# Patient Record
Sex: Female | Born: 1985 | Race: White | Hispanic: Yes | Marital: Single | Smoking: Never smoker
Health system: Southern US, Community
[De-identification: ages and names within clinical notes are randomized; demographics above are authoritative.]

## PROBLEM LIST (undated history)

## (undated) ENCOUNTER — Emergency Department (HOSPITAL_COMMUNITY): Admission: EM | Payer: Self-pay

## (undated) DIAGNOSIS — K219 Gastro-esophageal reflux disease without esophagitis: Secondary | ICD-10-CM

## (undated) DIAGNOSIS — J189 Pneumonia, unspecified organism: Secondary | ICD-10-CM

## (undated) DIAGNOSIS — D649 Anemia, unspecified: Secondary | ICD-10-CM

## (undated) DIAGNOSIS — O1490 Unspecified pre-eclampsia, unspecified trimester: Secondary | ICD-10-CM

## (undated) DIAGNOSIS — D219 Benign neoplasm of connective and other soft tissue, unspecified: Secondary | ICD-10-CM

## (undated) DIAGNOSIS — O24419 Gestational diabetes mellitus in pregnancy, unspecified control: Secondary | ICD-10-CM

## (undated) DIAGNOSIS — F419 Anxiety disorder, unspecified: Secondary | ICD-10-CM

## (undated) HISTORY — PX: WISDOM TOOTH EXTRACTION: SHX21

## (undated) HISTORY — DX: Benign neoplasm of connective and other soft tissue, unspecified: D21.9

## (undated) HISTORY — DX: Gestational diabetes mellitus in pregnancy, unspecified control: O24.419

---

## 2014-08-12 ENCOUNTER — Ambulatory Visit: Payer: Self-pay | Admitting: Surgery

## 2014-08-12 NOTE — H&P (Signed)
Dana Smith 08/12/2014 10:56 AM Location: [facility] Patient #: [id number] DOB: 1985/05/25 Single / Language: Dana Smith / Race: White Female History of Present Illness Dana Moores A. Breeanne Oblinger MD; 08/12/2014 11:19 AM) Patient words: mass on upper back    Pt presents with the complaint of mass upper right back for 1 year. It mis located over right upper back and is causing mild to moderate discomfort and increasing in size. It causes a dull aching pain over right shoulder. No drainage.  The patient is a 29 year old female   Other Problems Dana Smith, CMA; 08/12/2014 10:56 AM) Back Pain  Past Surgical History Dana Smith, CMA; 08/12/2014 10:56 AM) No pertinent past surgical history  Diagnostic Studies History Dana Smith, CMA; 08/12/2014 10:56 AM) Colonoscopy never Mammogram never Pap Smear 1-5 years ago  Allergies Dana Smith, CMA; 08/12/2014 10:56 AM) No Known Drug Allergies 08/12/2014  Medication History Dana Smith, CMA; 08/12/2014 10:56 AM) No Current Medications Medications Reconciled  Social History Dana Smith, CMA; 08/12/2014 10:56 AM) Alcohol use Moderate alcohol use. Caffeine use Coffee. No drug use Tobacco use Never smoker.  Family History Dana Smith, [place]; 08/12/2014 10:56 AM) First Degree Relatives No pertinent family history  Pregnancy / Birth History Dana Smith, [place]; 08/12/2014 10:56 AM) Age at menarche 60 years. Gravida 0 Para 0 Regular periods     Review of Systems Dana Smith CMA; 08/12/2014 10:56 AM) General Not Present- Appetite Loss, Chills, Fatigue, Fever, Night Sweats, Weight Gain and Weight Loss. Skin Not Present- Change in Wart/Mole, Dryness, Hives, Jaundice, New Lesions, Non-Healing Wounds, Rash and Ulcer. HEENT Present- Wears glasses/contact lenses. Not Present- Earache, Hearing Loss, Hoarseness, Nose Bleed, Oral Ulcers, Ringing in the Ears, Seasonal Allergies, Sinus Pain, Sore Throat, Visual Disturbances and  Yellow Eyes. Respiratory Not Present- Bloody sputum, Chronic Cough, Difficulty Breathing, Snoring and Wheezing. Breast Not Present- Breast Mass, Breast Pain, Nipple Discharge and Skin Changes. Cardiovascular Not Present- Chest Pain, Difficulty Breathing Lying Down, Leg Cramps, Palpitations, Rapid Heart Rate, Shortness of Breath and Swelling of Extremities. Gastrointestinal Not Present- Abdominal Pain, Bloating, Bloody Stool, Change in Bowel Habits, Chronic diarrhea, Constipation, Difficulty Swallowing, Excessive gas, Gets full quickly at meals, Hemorrhoids, Indigestion, Nausea, Rectal Pain and Vomiting. Female Genitourinary Not Present- Frequency, Nocturia, Painful Urination, Pelvic Pain and Urgency. Musculoskeletal Present- Back Pain, Joint Stiffness and Muscle Pain. Not Present- Joint Pain, Muscle Weakness and Swelling of Extremities. Neurological Not Present- Decreased Memory, Fainting, Headaches, Numbness, Seizures, Tingling, Tremor, Trouble walking and Weakness. Psychiatric Not Present- Anxiety, Bipolar, Change in Sleep Pattern, Depression, Fearful and Frequent crying. Endocrine Not Present- Cold Intolerance, Excessive Hunger, Hair Changes, Heat Intolerance, Hot flashes and New Diabetes. Hematology Not Present- Easy Bruising, Excessive bleeding, Gland problems, HIV and Persistent Infections.  Vitals Dana Smith CMA; 08/12/2014 10:57 AM) 08/12/2014 10:56 AM Weight: 238 lb Height: 66in Body Surface Area: 2.24 m Body Mass Index: 38.41 kg/m Temp.: 98.108F(Oral)  Pulse: 89 (Regular)  Resp.: 18 (Unlabored)  BP: 122/78 (Sitting, Left Arm, Standard)     Physical Exam (Carmin Alvidrez A. Eleonore Shippee MD; 08/12/2014 11:24 AM)  General Mental Status-Alert. General Appearance-Consistent with stated age. Hydration-Well hydrated. Voice-Normal.  Integumentary Note: 6 cm x 6 cm mobilemas over rright scapula    Head and Neck Head-normocephalic, atraumatic with no lesions or  palpable masses. Trachea-midline. Thyroid Gland Characteristics - normal size and consistency.  Eye Eyeball - Bilateral-Extraocular movements intact. Sclera/Conjunctiva - Bilateral-No scleral icterus.  Chest and Lung Exam Note: lungs clear   Neurologic Neurologic evaluation reveals -alert  and oriented x 3 with no impairment of recent or remote memory. Mental Status-Normal.  Musculoskeletal Normal Exam - Left-Upper Extremity Strength Normal and Lower Extremity Strength Normal. Normal Exam - Right-Upper Extremity Strength Normal and Lower Extremity Strength Normal.    Assessment & Plan (Shalimar Mcclain A. 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 Risk include bleeding infection nerve injury recurrence seroma. Non surgical options discussed. She would like to proceed.  Current Plans Pt Education - Benign Tumors: discussed with patient and provided information. Pt Education - CCS Free Text Education/Instructions: discussed with patient and provided information.

## 2018-08-11 ENCOUNTER — Ambulatory Visit (HOSPITAL_COMMUNITY)
Admission: AD | Admit: 2018-08-11 | Discharge: 2018-08-12 | Disposition: A | Discharge status: Home / Self Care | Payer: 59 | Attending: Obstetrics and Gynecology | Admitting: Obstetrics and Gynecology

## 2018-08-11 ENCOUNTER — Encounter (HOSPITAL_COMMUNITY): Payer: Self-pay | Admitting: *Deleted

## 2018-08-11 ENCOUNTER — Encounter (HOSPITAL_COMMUNITY)
Admission: AD | Disposition: A | Discharge status: Home / Self Care | Payer: Self-pay | Source: Home / Self Care | Attending: Obstetrics and Gynecology

## 2018-08-11 ENCOUNTER — Inpatient Hospital Stay (HOSPITAL_COMMUNITY): Payer: 59

## 2018-08-11 ENCOUNTER — Other Ambulatory Visit: Payer: Self-pay

## 2018-08-11 DIAGNOSIS — Z1159 Encounter for screening for other viral diseases: Secondary | ICD-10-CM | POA: Diagnosis not present

## 2018-08-11 DIAGNOSIS — R102 Pelvic and perineal pain unspecified side: Secondary | ICD-10-CM

## 2018-08-11 DIAGNOSIS — Z79899 Other long term (current) drug therapy: Secondary | ICD-10-CM | POA: Insufficient documentation

## 2018-08-11 DIAGNOSIS — O009 Unspecified ectopic pregnancy without intrauterine pregnancy: Secondary | ICD-10-CM | POA: Diagnosis present

## 2018-08-11 DIAGNOSIS — R100 Acute abdomen: Secondary | ICD-10-CM | POA: Diagnosis not present

## 2018-08-11 DIAGNOSIS — O00102 Left tubal pregnancy without intrauterine pregnancy: Secondary | ICD-10-CM | POA: Diagnosis not present

## 2018-08-11 DIAGNOSIS — K429 Umbilical hernia without obstruction or gangrene: Secondary | ICD-10-CM | POA: Diagnosis not present

## 2018-08-11 DIAGNOSIS — O039 Complete or unspecified spontaneous abortion without complication: Secondary | ICD-10-CM

## 2018-08-11 HISTORY — PX: LAPAROSCOPY: SHX197

## 2018-08-11 LAB — URINALYSIS, ROUTINE W REFLEX MICROSCOPIC
Bilirubin Urine: NEGATIVE
Glucose, UA: NEGATIVE mg/dL
Hgb urine dipstick: NEGATIVE
Ketones, ur: 80 mg/dL — AB
Nitrite: NEGATIVE
Protein, ur: 30 mg/dL — AB
Specific Gravity, Urine: 1.032 — ABNORMAL HIGH (ref 1.005–1.030)
pH: 5 (ref 5.0–8.0)

## 2018-08-11 LAB — COMPREHENSIVE METABOLIC PANEL
ALT: 15 U/L (ref 0–44)
AST: 20 U/L (ref 15–41)
Albumin: 3.5 g/dL (ref 3.5–5.0)
Alkaline Phosphatase: 90 U/L (ref 38–126)
Anion gap: 10 (ref 5–15)
BUN: 12 mg/dL (ref 6–20)
CO2: 23 mmol/L (ref 22–32)
Calcium: 9.1 mg/dL (ref 8.9–10.3)
Chloride: 103 mmol/L (ref 98–111)
Creatinine, Ser: 0.8 mg/dL (ref 0.44–1.00)
GFR calc Af Amer: >60 mL/min (ref >60)
GFR calc non Af Amer: >60 mL/min (ref >60)
Glucose, Bld: 183 mg/dL — ABNORMAL HIGH (ref 70–99)
Potassium: 4.6 mmol/L (ref 3.5–5.1)
Sodium: 136 mmol/L (ref 135–145)
Total Bilirubin: 0.8 mg/dL (ref 0.3–1.2)
Total Protein: 6.6 g/dL (ref 6.5–8.1)

## 2018-08-11 LAB — CBC WITH DIFFERENTIAL/PLATELET
Abs Immature Granulocytes: 0.07 10*3/uL (ref 0.00–0.07)
Basophils Absolute: 0 10*3/uL (ref 0.0–0.1)
Basophils Relative: 0 %
Eosinophils Absolute: 0 10*3/uL (ref 0.0–0.5)
Eosinophils Relative: 0 %
HCT: 33.8 % — ABNORMAL LOW (ref 36.0–46.0)
Hemoglobin: 10.5 g/dL — ABNORMAL LOW (ref 12.0–15.0)
Immature Granulocytes: 0 %
Lymphocytes Relative: 6 %
Lymphs Abs: 1 10*3/uL (ref 0.7–4.0)
MCH: 26.9 pg (ref 26.0–34.0)
MCHC: 31.1 g/dL (ref 30.0–36.0)
MCV: 86.7 fL (ref 80.0–100.0)
Monocytes Absolute: 0.5 10*3/uL (ref 0.1–1.0)
Monocytes Relative: 3 %
Neutro Abs: 14.1 10*3/uL — ABNORMAL HIGH (ref 1.7–7.7)
Neutrophils Relative %: 91 %
Platelets: 308 10*3/uL (ref 150–400)
RBC: 3.9 MIL/uL (ref 3.87–5.11)
RDW: 14.5 % (ref 11.5–15.5)
WBC: 15.6 10*3/uL — ABNORMAL HIGH (ref 4.0–10.5)
nRBC: 0 % (ref 0.0–0.2)

## 2018-08-11 LAB — WET PREP, GENITAL
Clue Cells Wet Prep HPF POC: NONE SEEN
Sperm: NONE SEEN
Trich, Wet Prep: NONE SEEN

## 2018-08-11 LAB — HCG, QUANTITATIVE, PREGNANCY: hCG, Beta Chain, Quant, S: 136 m[IU]/mL — ABNORMAL HIGH (ref <5)

## 2018-08-11 SURGERY — LAPAROSCOPY, DIAGNOSTIC
Anesthesia: General | Laterality: Left

## 2018-08-11 MED ORDER — IOHEXOL 300 MG/ML  SOLN
100.0000 mL | Freq: Once | INTRAMUSCULAR | Status: AC | PRN
  Administered 2018-08-11: 100 mL via INTRAVENOUS

## 2018-08-11 MED ORDER — BARIUM SULFATE 2.1 % PO SUSP: 450.0000 mL | Freq: Once | ORAL | Status: DC

## 2018-08-11 MED ORDER — HYDROMORPHONE HCL 2 MG PO TABS
1.0000 mg | ORAL_TABLET | ORAL | Status: DC | PRN
  Filled 2018-08-11: qty 1

## 2018-08-11 MED ORDER — SUFENTANIL CITRATE 50 MCG/ML IV SOLN
INTRAVENOUS | Status: AC
  Filled 2018-08-11: qty 1

## 2018-08-11 MED ORDER — SODIUM CHLORIDE 0.9 % IV SOLN
8.0000 mg | Freq: Once | INTRAVENOUS | Status: AC
  Administered 2018-08-11: 21:00:00 8 mg via INTRAVENOUS
  Filled 2018-08-11: qty 4

## 2018-08-11 MED ORDER — MIDAZOLAM HCL 2 MG/2ML IJ SOLN
INTRAMUSCULAR | Status: AC
  Filled 2018-08-11: qty 2

## 2018-08-11 MED ORDER — HYDROMORPHONE HCL 1 MG/ML IJ SOLN
1.0000 mg | INTRAMUSCULAR | Status: DC | PRN
  Administered 2018-08-11 (×2): 1 mg via INTRAVENOUS
  Filled 2018-08-11 (×2): qty 1

## 2018-08-11 MED ORDER — LACTATED RINGERS IV SOLN
INTRAVENOUS | Status: DC
  Administered 2018-08-11 – 2018-08-12 (×2): via INTRAVENOUS

## 2018-08-11 MED ORDER — LACTATED RINGERS IV BOLUS
1000.0000 mL | Freq: Once | INTRAVENOUS | Status: AC
  Administered 2018-08-11: 1000 mL via INTRAVENOUS

## 2018-08-11 MED ORDER — PROPOFOL 10 MG/ML IV BOLUS
INTRAVENOUS | Status: AC
  Filled 2018-08-11: qty 20

## 2018-08-11 MED ORDER — PROMETHAZINE HCL 25 MG/ML IJ SOLN
12.5000 mg | Freq: Once | INTRAMUSCULAR | Status: AC
  Administered 2018-08-11: 12.5 mg via INTRAVENOUS
  Filled 2018-08-11: qty 1

## 2018-08-11 SURGICAL SUPPLY — 37 items
CABLE HIGH FREQUENCY MONO STRZ (ELECTRODE) IMPLANT
CATH ROBINSON RED A/P 16FR (CATHETERS) IMPLANT
COVER WAND RF STERILE (DRAPES) ×3 IMPLANT
DECANTER SPIKE VIAL GLASS SM (MISCELLANEOUS) ×3 IMPLANT
DERMABOND ADVANCED (GAUZE/BANDAGES/DRESSINGS) ×2
DERMABOND ADVANCED .7 DNX12 (GAUZE/BANDAGES/DRESSINGS) ×1 IMPLANT
DRSG OPSITE POSTOP 3X4 (GAUZE/BANDAGES/DRESSINGS) ×3 IMPLANT
DURAPREP 26ML APPLICATOR (WOUND CARE) ×3 IMPLANT
FILTER SMOKE EVAC LAPAROSHD (FILTER) ×3 IMPLANT
GLOVE BIO SURGEON STRL SZ 6.5 (GLOVE) ×2 IMPLANT
GLOVE BIO SURGEONS STRL SZ 6.5 (GLOVE) ×1
GLOVE BIOGEL PI IND STRL 7.0 (GLOVE) ×1 IMPLANT
GLOVE BIOGEL PI INDICATOR 7.0 (GLOVE) ×2
GOWN STRL REUS W/ TWL LRG LVL3 (GOWN DISPOSABLE) ×2 IMPLANT
GOWN STRL REUS W/TWL LRG LVL3 (GOWN DISPOSABLE) ×4
HIBICLENS CHG 4% 4OZ BTL (MISCELLANEOUS) ×3 IMPLANT
KIT TURNOVER KIT B (KITS) ×3 IMPLANT
MANIPULATOR UTERINE 7CM CLEARV (MISCELLANEOUS) IMPLANT
NS IRRIG 1000ML POUR BTL (IV SOLUTION) ×3 IMPLANT
PACK LAPAROSCOPY BASIN (CUSTOM PROCEDURE TRAY) ×3 IMPLANT
PACK TRENDGUARD 450 HYBRID PRO (MISCELLANEOUS) ×1 IMPLANT
POUCH SPECIMEN RETRIEVAL 10MM (ENDOMECHANICALS) ×3 IMPLANT
PROTECTOR NERVE ULNAR (MISCELLANEOUS) ×6 IMPLANT
SET IRRIG TUBING LAPAROSCOPIC (IRRIGATION / IRRIGATOR) ×3 IMPLANT
SET TUBE SMOKE EVAC HIGH FLOW (TUBING) ×3 IMPLANT
SHEARS HARMONIC ACE PLUS 36CM (ENDOMECHANICALS) ×3 IMPLANT
SLEEVE ENDOPATH XCEL 5M (ENDOMECHANICALS) ×3 IMPLANT
SOLUTION ELECTROLUBE (MISCELLANEOUS) ×3 IMPLANT
SUT VIC AB 3-0 PS2 18 (SUTURE) ×2
SUT VIC AB 3-0 PS2 18XBRD (SUTURE) ×1 IMPLANT
SUT VICRYL 0 UR6 27IN ABS (SUTURE) ×3 IMPLANT
SYSTEM CARTER THOMASON II (TROCAR) ×3 IMPLANT
TOWEL GREEN STERILE FF (TOWEL DISPOSABLE) ×6 IMPLANT
TRENDGUARD 450 HYBRID PRO PACK (MISCELLANEOUS) ×3
TROCAR XCEL NON-BLD 11X100MML (ENDOMECHANICALS) ×3 IMPLANT
TROCAR XCEL NON-BLD 5MMX100MML (ENDOMECHANICALS) ×3 IMPLANT
WARMER LAPAROSCOPE (MISCELLANEOUS) ×3 IMPLANT

## 2018-08-11 NOTE — MAU Provider Note (Addendum)
History     CSN: [id number]  Arrival date and time: 08/11/18 1515   First Provider Initiated Contact with Patient 08/11/18 1622      Chief Complaint  Patient presents with  . Abdominal Pain   G1P0 s/p SAB 3 weeks ago presenting with LAP. Pain started last night. Describes as achy and constant, bilateral and central. Rates 9/10. Took Tylenol but didn't help. Had N/V last night and twice this am. No fevers but had chills. She is no longer bleeding, stopped 2 days ago. Denies urinary sx but is being treated for presumed UTI after video office visit today.    OB History    Gravida  1   Para      Term      Preterm      AB  1   Living  0     SAB  1   TAB      Ectopic      Multiple      Live Births  0           Past Medical History:  Diagnosis Date  . Medical history non-contributory     Past Surgical History:  Procedure Laterality Date  . WISDOM TOOTH EXTRACTION Bilateral     Family History  Problem Relation Age of Onset  . Healthy Mother   . Healthy Father     Social History   Tobacco Use  . Smoking status: Never Smoker  . Smokeless tobacco: Never Used  Substance Use Topics  . Alcohol use: Yes    Comment: Socially  . Drug use: Never    Allergies: No Known Allergies  Medications Prior to Admission  Medication Sig Dispense Refill Last Dose  . acetaminophen (TYLENOL) 500 MG tablet Take 1,000 mg by mouth every 6 (six) hours as needed.   08/11/2018 at 1200    Review of Systems  Constitutional: Positive for chills. Negative for fever.  Gastrointestinal: Positive for abdominal pain, nausea and vomiting. Negative for constipation and diarrhea.  Genitourinary: Negative for dysuria, hematuria, urgency, vaginal bleeding and vaginal discharge.   Physical Exam   Blood pressure (!) 104/57, pulse 75, temperature 97.9 F (36.6 C), temperature source Oral, resp. rate 16, last menstrual period 05/14/2018, SpO2 100 %.  Physical Exam  Constitutional:  She is oriented to person, place, and time. She appears well-developed and well-nourished. No distress.  HENT:  Head: Normocephalic and atraumatic.  Neck: Normal range of motion.  Cardiovascular: Normal rate.  Respiratory: Effort normal. No respiratory distress.  GI: Soft. She exhibits no distension and no mass. There is abdominal tenderness in the right lower quadrant, suprapubic area and left lower quadrant. There is guarding. There is no rebound.  Genitourinary:    Genitourinary Comments: External: no lesions or erythema Vagina: rugated, pink, moist, large amt adherent curdy white discharge Uterus: non enlarged, anteverted, ++ tender, + CMT Adnexae: no masses, ++tenderness left, ++ tenderness right Cervix closed, normal    Musculoskeletal: Normal range of motion.  Neurological: She is alert and oriented to person, place, and time.  Skin: Skin is warm and dry.  Psychiatric: She has a normal mood and affect.   Results for orders placed or performed during the hospital encounter of 08/11/18 (from the past 24 hour(s))  Wet prep, genital     Status: Abnormal   Collection Time: 08/11/18  4:40 PM   Specimen: Vaginal  Result Value Ref Range   Yeast Wet Prep HPF POC PRESENT (A) NONE SEEN  Trich, Wet Prep NONE SEEN NONE SEEN   Clue Cells Wet Prep HPF POC NONE SEEN NONE SEEN   WBC, Wet Prep HPF POC MANY (A) NONE SEEN   Sperm NONE SEEN   Comprehensive metabolic panel     Status: Abnormal   Collection Time: 08/11/18  4:49 PM  Result Value Ref Range   Sodium 136 135 - 145 mmol/L   Potassium 4.6 3.5 - 5.1 mmol/L   Chloride 103 98 - 111 mmol/L   CO2 23 22 - 32 mmol/L   Glucose, Bld 183 (H) 70 - 99 mg/dL   BUN 12 6 - 20 mg/dL   Creatinine, Ser 0.80 0.44 - 1.00 mg/dL   Calcium 9.1 8.9 - 10.3 mg/dL   Total Protein 6.6 6.5 - 8.1 g/dL   Albumin 3.5 3.5 - 5.0 g/dL   AST 20 15 - 41 U/L   ALT 15 0 - 44 U/L   Alkaline Phosphatase 90 38 - 126 U/L   Total Bilirubin 0.8 0.3 - 1.2 mg/dL   GFR  calc non Af Amer >60 >60 mL/min   GFR calc Af Amer >60 >60 mL/min   Anion gap 10 5 - 15  CBC with Differential/Platelet     Status: Abnormal   Collection Time: 08/11/18  4:49 PM  Result Value Ref Range   WBC 15.6 (H) 4.0 - 10.5 K/uL   RBC 3.90 3.87 - 5.11 MIL/uL   Hemoglobin 10.5 (L) 12.0 - 15.0 g/dL   HCT 33.8 (L) 36.0 - 46.0 %   MCV 86.7 80.0 - 100.0 fL   MCH 26.9 26.0 - 34.0 pg   MCHC 31.1 30.0 - 36.0 g/dL   RDW 14.5 11.5 - 15.5 %   Platelets 308 150 - 400 K/uL   nRBC 0.0 0.0 - 0.2 %   Neutrophils Relative % 91 %   Neutro Abs 14.1 (H) 1.7 - 7.7 K/uL   Lymphocytes Relative 6 %   Lymphs Abs 1.0 0.7 - 4.0 K/uL   Monocytes Relative 3 %   Monocytes Absolute 0.5 0.1 - 1.0 K/uL   Eosinophils Relative 0 %   Eosinophils Absolute 0.0 0.0 - 0.5 K/uL   Basophils Relative 0 %   Basophils Absolute 0.0 0.0 - 0.1 K/uL   Immature Granulocytes 0 %   Abs Immature Granulocytes 0.07 0.00 - 0.07 K/uL  Urinalysis, Routine w reflex microscopic     Status: Abnormal   Collection Time: 08/11/18  6:28 PM  Result Value Ref Range   Color, Urine AMBER (A) YELLOW   APPearance CLEAR CLEAR   Specific Gravity, Urine 1.032 (H) 1.005 - 1.030   pH 5.0 5.0 - 8.0   Glucose, UA NEGATIVE NEGATIVE mg/dL   Hgb urine dipstick NEGATIVE NEGATIVE   Bilirubin Urine NEGATIVE NEGATIVE   Ketones, ur 80 (A) NEGATIVE mg/dL   Protein, ur 30 (A) NEGATIVE mg/dL   Nitrite NEGATIVE NEGATIVE   Leukocytes,Ua MODERATE (A) NEGATIVE   RBC / HPF 0-5 0 - 5 RBC/hpf   WBC, UA 21-50 0 - 5 WBC/hpf   Bacteria, UA RARE (A) NONE SEEN   Squamous Epithelial / LPF 6-10 0 - 5   Mucus PRESENT    US Ob Transvaginal  Result Date: 08/11/2018 CLINICAL DATA:  History of spontaneous abortion.  Pelvic pain. EXAM: TRANSVAGINAL OB ULTRASOUND TECHNIQUE: Transvaginal ultrasound was performed for complete evaluation of the gestation as well as the maternal uterus, adnexal regions, and pelvic cul-de-sac. COMPARISON:  None. FINDINGS: Intrauterine  gestational sac:  None Yolk sac:  N/A Embryo:  N/A Cardiac Activity: N/A Heart Rate: N/A bpm Subchorionic hemorrhage:  N/A Maternal uterus/adnexae: No endometrial thickening or abnormal enhancement. No endometrial fluid collections. Both ovaries are normal. The right ovary measures 3.6 x 2.4 x 2.7 cm and the left ovary measures 2.7 x 1.9 x 1.6 cm. Small amount of free pelvic fluid with inhomogeneous complex fluid in the para necks or regions bilaterally. Possible ruptured cyst. IMPRESSION: 1. Normal sonographic appearance of the uterus. No findings suspicious for retained products. 2. Normal ovaries. 3. Small amount of fluid in the cul-de-sac and complex fluid in both periadnexal regions. Possible ruptured cyst. Electronically Signed   By: Marijo Sanes M.D.   On: 08/11/2018 18:08   Ct Abdomen Pelvis W Contrast  Result Date: 08/11/2018 CLINICAL DATA:  Acute abdominal pain. Recent miscarriage. EXAM: CT ABDOMEN AND PELVIS WITH CONTRAST TECHNIQUE: Multidetector CT imaging of the abdomen and pelvis was performed using the standard protocol following bolus administration of intravenous contrast. CONTRAST:  165mL OMNIPAQUE IOHEXOL 300 MG/ML  SOLN COMPARISON:  Pelvic ultrasound earlier this day. FINDINGS: Lower chest: Lung bases are clear. Hepatobiliary: No focal liver abnormality is seen. No gallstones, gallbladder wall thickening, or biliary dilatation. Pancreas: No ductal dilatation or inflammation. Spleen: Normal in size without focal abnormality. Adrenals/Urinary Tract: Normal adrenal glands. No hydronephrosis or perinephric edema. Homogeneous renal enhancement. Urinary bladder is decompressed and not well evaluated. Stomach/Bowel: High-density fluid insinuating between small bowel loops in the left abdomen. No associated small bowel inflammation or wall thickening. No bowel obstruction. Appendix is normal, for example image 67 series 3. Vascular/Lymphatic: Normal caliber abdominal aorta. No adenopathy.  Reproductive: Heterogeneous lesion in the left adnexa measures 4.5 x 5.1 cm. There is some irregular enhancement about the periphery, and difficult to confirm all of contrast is intravascular. Large amount of heterogeneous fluid in the pelvis tracks into the pericolic gutters, throughout the mesentery and into the upper abdomen. Other: Moderate to large volume complex free fluid appears centered in the pelvis, tracking into the pericolic gutters and bilateral upper quadrants. This is most prominent adjacent to the left ovary where there regular foci of enhancement, difficult to exclude active bleeding. No free intra-abdominal air. Tiny fat containing umbilical hernia. Musculoskeletal: There are no acute or suspicious osseous abnormalities. IMPRESSION: Moderate to large amount of hemorrhage in the abdomen and pelvis. Source appears to be the left adnexa where there is 5.1 x 4.5 cm heterogeneous ill-defined adnexal lesion with enhancement. Blood tracks into both pericolic gutters, small bowel mesentery and upper abdomen. Differential considerations include a ruptured hemorrhagic ovarian cyst with possible active bleeding, versus ruptured ectopic pregnancy. These results were called by telephone at the time of interpretation on 08/11/2018 at 10:56 pm to [facility] , who verbally acknowledged these results. Electronically Signed   By: Keith Rake M.D.   On: 08/11/2018 22:58    MAU Course  Procedures Orders Placed This Encounter  Procedures  . Wet prep, genital    Standing Status:   Standing    Number of Occurrences:   1    Order Specific Question:   Patient immune status    Answer:   Normal  . Culture, Urine    Standing Status:   Standing    Number of Occurrences:   1  . US OB Transvaginal    R/o retained POCs    Standing Status:   Standing    Number of Occurrences:   1    Order  Specific Question:   Symptom/Reason for Exam    Answer:   SAB (spontaneous abortion) [[id number]]    Order Specific  Question:   Symptom/Reason for Exam    Answer:   Pelvic pain [[id number]]  . CT ABDOMEN PELVIS W CONTRAST    Standing Status:   Standing    Number of Occurrences:   1    Order Specific Question:   Does the patient have a contrast media/X-ray dye allergy?    Answer:   No    Order Specific Question:   If indicated for the ordered procedure, I authorize the administration of contrast media per Radiology protocol    Answer:   Yes    Order Specific Question:   Is Oral Contrast requested for this exam?    Answer:   Yes, Per Radiology protocol    Order Specific Question:   Radiology Contrast Protocol - do NOT remove file path    Answer:   [email or web address]  . Comprehensive metabolic panel    Standing Status:   Standing    Number of Occurrences:   1  . CBC with Differential/Platelet    Standing Status:   Standing    Number of Occurrences:   1  . Urinalysis, Routine w reflex microscopic    Standing Status:   Standing    Number of Occurrences:   1   Meds ordered this encounter  Medications  . lactated ringers bolus 1,000 mL  . DISCONTD: HYDROmorphone (DILAUDID) tablet 1 mg  . HYDROmorphone (DILAUDID) injection 1 mg  . ondansetron (ZOFRAN) 8 mg in sodium chloride 0.9 % 50 mL IVPB  . lactated ringers infusion  . DISCONTD: Barium Sulfate 2.1 % SUSP 450 mL  . DISCONTD: Barium Sulfate 2.1 % SUSP 450 mL   MDM Labs and [place] ordered and reviewed. [place] normal. Pain modestly improved. CT ordered and pending. Transfer of care given to [place], [place]  08/11/2018 9:03 PM   Assessment and Plan  Follow Up (10:55 PM)  -Threasa Beards [place] calls from reading room to confirm patient with previous IUP as she feels findings may be c/w ruptured ectopic.  -However if known IUP then most likely ruptured hemorrhagic cyst from left ovary. -Informed that patient being followed by private practice and SAB occurred 3 weeks ago. -Further informed that [place] also reported possible  ruptured hemorrhagic cyst. -Threasa Beards expresses concern for amount of free fluid in abdomen.  Follow Up (11:17 PM)  -Official report received. -Impression citing possible active bleeding. -Dr. Eddie North consulted and informed of Radiologist findings; Advises  *Contact primary gyn. *Order quant hCG. -Dr. Terri Piedra consulted and updated on patient status. -In room to update patient; appears stable and in no apparent distress. -Patient informed of findings and need for surgical intervention. -Further informed that Dr. Terri Piedra en route and would discuss POC in further detail. -Co-Vid test and T&S ordered.  Maryann Conners MSN, CNM 08/11/2018 11:35 PM

## 2018-08-11 NOTE — MAU Note (Signed)
Having a lot of pelvic pain.  Started yesterday, vomiting off and on. "just bile cause there is nothing in her stomach".  Had a miscarriage, still going in for hormone checks, was 300 yesterday. Stopped bleeding 2 days ago.  Being followed at [facility]. Virtual visit today, being treated for a UTI.  Now having chills and dizziness, started in last 2-3 hrs. (pt clammy, lips very pale)

## 2018-08-11 NOTE — MAU Note (Signed)
[place] tech reports pt got sick when she brought her back

## 2018-08-12 ENCOUNTER — Inpatient Hospital Stay (HOSPITAL_COMMUNITY): Payer: 59 | Admitting: Anesthesiology

## 2018-08-12 ENCOUNTER — Encounter (HOSPITAL_COMMUNITY): Payer: Self-pay

## 2018-08-12 DIAGNOSIS — R100 Acute abdomen: Secondary | ICD-10-CM | POA: Diagnosis present

## 2018-08-12 LAB — SARS CORONAVIRUS 2: SARS Coronavirus 2: NOT DETECTED

## 2018-08-12 LAB — CBC
HCT: 33.7 % — ABNORMAL LOW (ref 36.0–46.0)
Hemoglobin: 11 g/dL — ABNORMAL LOW (ref 12.0–15.0)
MCH: 27.9 pg (ref 26.0–34.0)
MCHC: 32.6 g/dL (ref 30.0–36.0)
MCV: 85.5 fL (ref 80.0–100.0)
Platelets: 259 10*3/uL (ref 150–400)
RBC: 3.94 MIL/uL (ref 3.87–5.11)
RDW: 14 % (ref 11.5–15.5)
WBC: 12.9 10*3/uL — ABNORMAL HIGH (ref 4.0–10.5)
nRBC: 0 % (ref 0.0–0.2)

## 2018-08-12 LAB — ABO/RH: ABO/RH(D): O POS

## 2018-08-12 MED ORDER — IBUPROFEN 600 MG PO TABS: 600.0000 mg | ORAL_TABLET | Freq: Four times a day (QID) | ORAL | 1 refills | Status: DC | PRN

## 2018-08-12 MED ORDER — LACTATED RINGERS IV SOLN
INTRAVENOUS | Status: DC | PRN
  Administered 2018-08-12: 01:00:00 via INTRAVENOUS

## 2018-08-12 MED ORDER — PANTOPRAZOLE SODIUM 40 MG IV SOLR: 40.0000 mg | Freq: Every day | INTRAVENOUS | Status: DC

## 2018-08-12 MED ORDER — OXYCODONE-ACETAMINOPHEN 5-325 MG PO TABS
1.0000 | ORAL_TABLET | ORAL | Status: DC | PRN
  Administered 2018-08-12: 1 via ORAL
  Filled 2018-08-12: qty 1

## 2018-08-12 MED ORDER — SUCCINYLCHOLINE CHLORIDE 200 MG/10ML IV SOSY
PREFILLED_SYRINGE | INTRAVENOUS | Status: AC
  Filled 2018-08-12: qty 10

## 2018-08-12 MED ORDER — ONDANSETRON HCL 4 MG/2ML IJ SOLN
INTRAMUSCULAR | Status: DC | PRN
  Administered 2018-08-12: 4 mg via INTRAVENOUS

## 2018-08-12 MED ORDER — BUPIVACAINE HCL (PF) 0.25 % IJ SOLN
INTRAMUSCULAR | Status: DC | PRN
  Administered 2018-08-12: 14 mL

## 2018-08-12 MED ORDER — ONDANSETRON HCL 4 MG/2ML IJ SOLN
INTRAMUSCULAR | Status: AC
  Filled 2018-08-12: qty 2

## 2018-08-12 MED ORDER — LIDOCAINE 2% (20 MG/ML) 5 ML SYRINGE
INTRAMUSCULAR | Status: AC
  Filled 2018-08-12: qty 5

## 2018-08-12 MED ORDER — OXYCODONE-ACETAMINOPHEN 5-325 MG PO TABS: 1.0000 | ORAL_TABLET | ORAL | 0 refills | Status: AC | PRN

## 2018-08-12 MED ORDER — PROPOFOL 10 MG/ML IV BOLUS
INTRAVENOUS | Status: DC | PRN
  Administered 2018-08-12: 50 mg via INTRAVENOUS
  Administered 2018-08-12: 150 mg via INTRAVENOUS

## 2018-08-12 MED ORDER — SODIUM CHLORIDE 0.9 % IR SOLN
Status: DC | PRN
  Administered 2018-08-12: 3000 mL

## 2018-08-12 MED ORDER — SODIUM CHLORIDE 0.9 % IV SOLN
3.0000 g | INTRAVENOUS | Status: AC
  Administered 2018-08-12: 02:00:00 3 g via INTRAVENOUS
  Filled 2018-08-12: qty 3

## 2018-08-12 MED ORDER — MIDAZOLAM HCL 5 MG/5ML IJ SOLN
INTRAMUSCULAR | Status: DC | PRN
  Administered 2018-08-12: 2 mg via INTRAVENOUS

## 2018-08-12 MED ORDER — KETOROLAC TROMETHAMINE 30 MG/ML IJ SOLN
30.0000 mg | Freq: Once | INTRAMUSCULAR | Status: AC
  Administered 2018-08-12: 30 mg via INTRAVENOUS

## 2018-08-12 MED ORDER — ONDANSETRON HCL 4 MG/2ML IJ SOLN: 4.0000 mg | Freq: Four times a day (QID) | INTRAMUSCULAR | Status: DC | PRN

## 2018-08-12 MED ORDER — KETOROLAC TROMETHAMINE 30 MG/ML IJ SOLN
INTRAMUSCULAR | Status: AC
  Filled 2018-08-12: qty 1

## 2018-08-12 MED ORDER — SIMETHICONE 80 MG PO CHEW: 80.0000 mg | CHEWABLE_TABLET | Freq: Four times a day (QID) | ORAL | Status: DC | PRN

## 2018-08-12 MED ORDER — SUFENTANIL CITRATE 50 MCG/ML IV SOLN
INTRAVENOUS | Status: DC | PRN
  Administered 2018-08-12 (×5): 10 ug via INTRAVENOUS

## 2018-08-12 MED ORDER — BUPIVACAINE HCL (PF) 0.25 % IJ SOLN
INTRAMUSCULAR | Status: AC
  Filled 2018-08-12: qty 30

## 2018-08-12 MED ORDER — ALBUMIN HUMAN 5 % IV SOLN: 12.5000 g | Freq: Once | INTRAVENOUS | Status: AC

## 2018-08-12 MED ORDER — DEXAMETHASONE SODIUM PHOSPHATE 10 MG/ML IJ SOLN
INTRAMUSCULAR | Status: DC | PRN
  Administered 2018-08-12: 10 mg via INTRAVENOUS

## 2018-08-12 MED ORDER — PROPOFOL 10 MG/ML IV BOLUS
INTRAVENOUS | Status: AC
  Filled 2018-08-12: qty 20

## 2018-08-12 MED ORDER — LIDOCAINE HCL (CARDIAC) PF 100 MG/5ML IV SOSY
PREFILLED_SYRINGE | INTRAVENOUS | Status: DC | PRN
  Administered 2018-08-12: 100 mg via INTRATRACHEAL

## 2018-08-12 MED ORDER — DEXAMETHASONE SODIUM PHOSPHATE 10 MG/ML IJ SOLN
INTRAMUSCULAR | Status: AC
  Filled 2018-08-12: qty 1

## 2018-08-12 MED ORDER — ALBUMIN HUMAN 5 % IV SOLN
INTRAVENOUS | Status: AC
  Administered 2018-08-12: 12.5 g via INTRAVENOUS
  Filled 2018-08-12: qty 250

## 2018-08-12 MED ORDER — ONDANSETRON HCL 4 MG PO TABS: 4.0000 mg | ORAL_TABLET | Freq: Four times a day (QID) | ORAL | Status: DC | PRN

## 2018-08-12 MED ORDER — FENTANYL CITRATE (PF) 100 MCG/2ML IJ SOLN: 25.0000 ug | INTRAMUSCULAR | Status: DC | PRN

## 2018-08-12 MED ORDER — SUCCINYLCHOLINE CHLORIDE 20 MG/ML IJ SOLN
INTRAMUSCULAR | Status: DC | PRN
  Administered 2018-08-12: 140 mg via INTRAVENOUS

## 2018-08-12 MED ORDER — SODIUM CHLORIDE 0.9 % IV SOLN
INTRAVENOUS | Status: DC | PRN
  Administered 2018-08-12: 01:00:00 via INTRAVENOUS

## 2018-08-12 MED ORDER — MAGNESIUM HYDROXIDE 400 MG/5ML PO SUSP: 30.0000 mL | Freq: Every day | ORAL | Status: DC | PRN

## 2018-08-12 MED ORDER — IBUPROFEN 800 MG PO TABS
800.0000 mg | ORAL_TABLET | Freq: Three times a day (TID) | ORAL | Status: DC
  Administered 2018-08-12: 800 mg via ORAL
  Filled 2018-08-12 (×2): qty 1

## 2018-08-12 MED ORDER — SODIUM CHLORIDE (PF) 0.9 % IJ SOLN
INTRAMUSCULAR | Status: AC
  Filled 2018-08-12: qty 10

## 2018-08-12 MED ORDER — MENTHOL 3 MG MT LOZG: 1.0000 | LOZENGE | OROMUCOSAL | Status: DC | PRN

## 2018-08-12 NOTE — Anesthesia Procedure Notes (Signed)
Procedure Name: Intubation Date/Time: 08/12/2018 1:05 AM Performed by: Claris Che, CRNA Pre-anesthesia Checklist: Patient identified, Emergency Drugs available, Suction available, Patient being monitored and Timeout performed Patient Re-evaluated:Patient Re-evaluated prior to induction Oxygen Delivery Method: Circle system utilized Preoxygenation: Pre-oxygenation with 100% oxygen Induction Type: IV induction, Rapid sequence and Cricoid Pressure applied Ventilation: Mask ventilation without difficulty Laryngoscope Size: Mac and 3 Grade View: Grade II Tube type: Oral Tube size: 7.5 mm Number of attempts: 1 Airway Equipment and Method: Stylet Placement Confirmation: ETT inserted through vocal cords under direct vision,  positive ETCO2 and breath sounds checked- equal and bilateral Secured at: 22 cm Tube secured with: Tape Dental Injury: Teeth and Oropharynx as per pre-operative assessment

## 2018-08-12 NOTE — H&P (Signed)
Dana Smith is an 33 y.o. G30P0010 female who presented to [facility] yesterday with complaint of severe acute abdominal pain, nausea, vomiting with begun in am two days ago. Pt reports symptoms subsided to a moderate level overnight but the recurred yesterday morning and progressively worsened hence her arrival at [facility]. Pt was diagnosed with a missed pregnancy/blighted ovum three weeks ago when her [place] at her pregnancy confirmation appointment failed to note an iup or adnexal mass. Pt has had trending quants downwards ; last check was approximately 300 on 08/09/2018. Pts pain has been managed with pain medicine while in [facility]U. [place] in [facility]U showed nl findings but due to persistent pain, a CT was ordered and showed nl appendix, a heterogenous ill defined lesion in left adnexa 4.5x5.1cm, moderate to large amount of hemorrhage   Pertinent Gynecological History: Bleeding: none Contraception: none DES exposure: unknown Blood transfusions: none Sexually transmitted diseases: no past history Previous GYN Procedures: none  Last mammogram: n/a Date:  Last pap: normal Date: 07/2015 OB History: G1, P0010   Menstrual History: Menarche age: 26 Patient's last menstrual period was 05/14/2018.    Past Medical History:  Diagnosis Date  . Medical history non-contributory     Past Surgical History:  Procedure Laterality Date  . WISDOM TOOTH EXTRACTION Bilateral     Family History  Problem Relation Age of Onset  . Healthy Mother   . Healthy Father     Social History:  reports that she has never smoked. She has never used smokeless tobacco. She reports current alcohol use. She reports that she does not use drugs.  Allergies: No Known Allergies  Medications Prior to Admission  Medication Sig Dispense Refill Last Dose  . nitrofurantoin, macrocrystal-monohydrate, (MACROBID) 100 MG capsule Take 100 mg by mouth 2 (two) times daily.   08/11/2018  . acetaminophen (TYLENOL) 500 MG tablet Take 1,000 mg by mouth  every 6 (six) hours as needed.    at 1200    Review of Systems  Constitutional: Negative for chills, fever, malaise/fatigue and weight loss.  Eyes: Negative for blurred vision.  Respiratory: Negative for shortness of breath.   Cardiovascular: Negative for chest pain.  Gastrointestinal: Positive for abdominal pain, nausea and vomiting.  Genitourinary: Negative for dysuria.  Musculoskeletal: Positive for myalgias.  Skin: Negative for itching and rash.  Neurological: Negative for dizziness.  Endo/Heme/Allergies: Does not bruise/bleed easily.  Psychiatric/Behavioral: Negative for depression, hallucinations, substance abuse and suicidal ideas. The patient is not nervous/anxious.     Blood pressure (!) 102/57, pulse 75, temperature 98.5 F (36.9 C), resp. rate 19, height 5\' 6"  (1.676 m), weight 112.5 kg, last menstrual period 05/14/2018, SpO2 100 %. Physical Exam  Constitutional: She is oriented to person, place, and time. She appears well-developed and well-nourished.  Neck: Normal range of motion.  Cardiovascular: Normal rate.  GI: There is abdominal tenderness. There is guarding.  Musculoskeletal: Normal range of motion.  Neurological: She is alert and oriented to person, place, and time.  Skin: Skin is warm.  Psychiatric: She has a normal mood and affect. Her behavior is normal. Judgment and thought content normal.    Results for orders placed or performed during the hospital encounter of 08/11/18 (from the past 24 hour(s))  Wet prep, genital     Status: Abnormal   Collection Time: 08/11/18  4:40 PM   Specimen: Vaginal  Result Value Ref Range   Yeast Wet Prep HPF POC PRESENT (A) NONE SEEN   Trich, Wet Prep NONE  SEEN NONE SEEN   Clue Cells Wet Prep HPF POC NONE SEEN NONE SEEN   WBC, Wet Prep HPF POC MANY (A) NONE SEEN   Sperm NONE SEEN   Comprehensive metabolic panel     Status: Abnormal   Collection Time: 08/11/18  4:49 PM  Result Value Ref Range   Sodium 136 135 - 145 mmol/L    Potassium 4.6 3.5 - 5.1 mmol/L   Chloride 103 98 - 111 mmol/L   CO2 23 22 - 32 mmol/L   Glucose, Bld 183 (H) 70 - 99 mg/dL   BUN 12 6 - 20 mg/dL   Creatinine, Ser 0.80 0.44 - 1.00 mg/dL   Calcium 9.1 8.9 - 10.3 mg/dL   Total Protein 6.6 6.5 - 8.1 g/dL   Albumin 3.5 3.5 - 5.0 g/dL   AST 20 15 - 41 U/L   ALT 15 0 - 44 U/L   Alkaline Phosphatase 90 38 - 126 U/L   Total Bilirubin 0.8 0.3 - 1.2 mg/dL   GFR calc non Af Amer >60 >60 mL/min   GFR calc Af Amer >60 >60 mL/min   Anion gap 10 5 - 15  CBC with Differential/Platelet     Status: Abnormal   Collection Time: 08/11/18  4:49 PM  Result Value Ref Range   WBC 15.6 (H) 4.0 - 10.5 K/uL   RBC 3.90 3.87 - 5.11 MIL/uL   Hemoglobin 10.5 (L) 12.0 - 15.0 g/dL   HCT 33.8 (L) 36.0 - 46.0 %   MCV 86.7 80.0 - 100.0 fL   MCH 26.9 26.0 - 34.0 pg   MCHC 31.1 30.0 - 36.0 g/dL   RDW 14.5 11.5 - 15.5 %   Platelets 308 150 - 400 K/uL   nRBC 0.0 0.0 - 0.2 %   Neutrophils Relative % 91 %   Neutro Abs 14.1 (H) 1.7 - 7.7 K/uL   Lymphocytes Relative 6 %   Lymphs Abs 1.0 0.7 - 4.0 K/uL   Monocytes Relative 3 %   Monocytes Absolute 0.5 0.1 - 1.0 K/uL   Eosinophils Relative 0 %   Eosinophils Absolute 0.0 0.0 - 0.5 K/uL   Basophils Relative 0 %   Basophils Absolute 0.0 0.0 - 0.1 K/uL   Immature Granulocytes 0 %   Abs Immature Granulocytes 0.07 0.00 - 0.07 K/uL  hCG, quantitative, pregnancy     Status: Abnormal   Collection Time: 08/11/18  4:49 PM  Result Value Ref Range   hCG, Beta Chain, Quant, S 136 (H) <5 mIU/mL  Urinalysis, Routine w reflex microscopic     Status: Abnormal   Collection Time: 08/11/18  6:28 PM  Result Value Ref Range   Color, Urine AMBER (A) YELLOW   APPearance CLEAR CLEAR   Specific Gravity, Urine 1.032 (H) 1.005 - 1.030   pH 5.0 5.0 - 8.0   Glucose, UA NEGATIVE NEGATIVE mg/dL   Hgb urine dipstick NEGATIVE NEGATIVE   Bilirubin Urine NEGATIVE NEGATIVE   Ketones, ur 80 (A) NEGATIVE mg/dL   Protein, ur 30 (A) NEGATIVE  mg/dL   Nitrite NEGATIVE NEGATIVE   Leukocytes,Ua MODERATE (A) NEGATIVE   RBC / HPF 0-5 0 - 5 RBC/hpf   WBC, UA 21-50 0 - 5 WBC/hpf   Bacteria, UA RARE (A) NONE SEEN   Squamous Epithelial / LPF 6-10 0 - 5   Mucus PRESENT   Type and screen [place]     Status: None (Preliminary result)   Collection Time: 08/11/18 11:32  PM  Result Value Ref Range   ABO/RH(D) PENDING    Antibody Screen PENDING    Sample Expiration      08/14/2018,2359 Performed at [facility], [place] [address] [place], [place] [address]     US Ob Transvaginal  Result Date: 08/11/2018 CLINICAL DATA:  History of spontaneous abortion.  Pelvic pain. EXAM: TRANSVAGINAL OB ULTRASOUND TECHNIQUE: Transvaginal ultrasound was performed for complete evaluation of the gestation as well as the maternal uterus, adnexal regions, and pelvic cul-de-sac. COMPARISON:  None. FINDINGS: Intrauterine gestational sac: None Yolk sac:  N/A Embryo:  N/A Cardiac Activity: N/A Heart Rate: N/A bpm Subchorionic hemorrhage:  N/A Maternal uterus/adnexae: No endometrial thickening or abnormal enhancement. No endometrial fluid collections. Both ovaries are normal. The right ovary measures 3.6 x 2.4 x 2.7 cm and the left ovary measures 2.7 x 1.9 x 1.6 cm. Small amount of free pelvic fluid with inhomogeneous complex fluid in the para necks or regions bilaterally. Possible ruptured cyst. IMPRESSION: 1. Normal sonographic appearance of the uterus. No findings suspicious for retained products. 2. Normal ovaries. 3. Small amount of fluid in the cul-de-sac and complex fluid in both periadnexal regions. Possible ruptured cyst. Electronically Signed   By: Marijo Sanes M.D.   On: 08/11/2018 18:08   Ct Abdomen Pelvis W Contrast  Result Date: 08/11/2018 CLINICAL DATA:  Acute abdominal pain. Recent miscarriage. EXAM: CT ABDOMEN AND PELVIS WITH CONTRAST TECHNIQUE: Multidetector CT imaging of the abdomen and pelvis was performed using the standard  protocol following bolus administration of intravenous contrast. CONTRAST:  127mL OMNIPAQUE IOHEXOL 300 MG/ML  SOLN COMPARISON:  Pelvic ultrasound earlier this day. FINDINGS: Lower chest: Lung bases are clear. Hepatobiliary: No focal liver abnormality is seen. No gallstones, gallbladder wall thickening, or biliary dilatation. Pancreas: No ductal dilatation or inflammation. Spleen: Normal in size without focal abnormality. Adrenals/Urinary Tract: Normal adrenal glands. No hydronephrosis or perinephric edema. Homogeneous renal enhancement. Urinary bladder is decompressed and not well evaluated. Stomach/Bowel: High-density fluid insinuating between small bowel loops in the left abdomen. No associated small bowel inflammation or wall thickening. No bowel obstruction. Appendix is normal, for example image 67 series 3. Vascular/Lymphatic: Normal caliber abdominal aorta. No adenopathy. Reproductive: Heterogeneous lesion in the left adnexa measures 4.5 x 5.1 cm. There is some irregular enhancement about the periphery, and difficult to confirm all of contrast is intravascular. Large amount of heterogeneous fluid in the pelvis tracks into the pericolic gutters, throughout the mesentery and into the upper abdomen. Other: Moderate to large volume complex free fluid appears centered in the pelvis, tracking into the pericolic gutters and bilateral upper quadrants. This is most prominent adjacent to the left ovary where there regular foci of enhancement, difficult to exclude active bleeding. No free intra-abdominal air. Tiny fat containing umbilical hernia. Musculoskeletal: There are no acute or suspicious osseous abnormalities. IMPRESSION: Moderate to large amount of hemorrhage in the abdomen and pelvis. Source appears to be the left adnexa where there is 5.1 x 4.5 cm heterogeneous ill-defined adnexal lesion with enhancement. Blood tracks into both pericolic gutters, small bowel mesentery and upper abdomen. Differential  considerations include a ruptured hemorrhagic ovarian cyst with possible active bleeding, versus ruptured ectopic pregnancy. These results were called by telephone at the time of interpretation on 08/11/2018 at 10:56 pm to [facility] , who verbally acknowledged these results. Electronically Signed   By: Keith Rake M.D.   On: 08/11/2018 22:58    Assessment/Plan: 33yo G1P0 female with acute abdomen following  recent missed abortion for diagnostic laparoscopy, possible removal of ectopic pregnancy, possible salpingectomy, possible oopherectomy, possible open Procedure and post op expectations have been reviewed and all questions answered Consent verified  NPO To OR when ready  Venetia Night Flossie Wexler 08/12/2018, 12:34 AM

## 2018-08-12 NOTE — Anesthesia Postprocedure Evaluation (Signed)
Anesthesia Post Note  Patient: Dana Smith  Procedure(s) Performed: LAPAROSCOPY DIAGNOSTIC, LEFT SALPINGECTOMY WITH REMOVAL OF ECTOPIC PREGNANCY (Left )     Patient location during evaluation: PACU Anesthesia Type: General Level of consciousness: awake Pain management: pain level controlled Vital Signs Assessment: post-procedure vital signs reviewed and stable Respiratory status: spontaneous breathing Postop Assessment: no apparent nausea or vomiting Anesthetic complications: no    Last Vitals:  Vitals:   08/12/18 0345 08/12/18 0404  BP:  111/71  Pulse: 76 72  Resp: 16 18  Temp:  37.1 C  SpO2:  97%    Last Pain:  Vitals:   08/12/18 0404  TempSrc: Oral  PainSc:                  Shakeyla Giebler

## 2018-08-12 NOTE — Discharge Summary (Signed)
Physician Discharge Summary  Patient ID: Dana Smith MRN: [id number] DOB/AGE: [birth year] 33 y.o.  Admit date: 08/11/2018 Discharge date: 08/12/2018  Admission Diagnoses: acute abdomen  Discharge Diagnoses: same 2. Ruptured ectopic - s/p diagnostic laparoscopy with left salpingectomy  Active Problems:   Acute abdomen   Discharged Condition: stable  Hospital Course: Pt recovered well post op. Tolerating diet. Pain well controlled.   Consults: None  Significant Diagnostic Studies: labs: Hg 11  Treatments: IV hydration, antibiotics: Unasyn, analgesia: percocet, ibuprofen, surgery: diagnostic laparoscopy with left salpingectomy  and 2 units prbcs transfused  Discharge Exam: Blood pressure 111/71, pulse 72, temperature 98.7 F (37.1 C), temperature source Oral, resp. rate 18, height 5\' 6"  (1.676 m), weight 116.6 kg, last menstrual period 05/14/2018, SpO2 97 %. General appearance: alert and no distress  Disposition: Discharge disposition: 01-Home or Self Care       Discharge Instructions    Call MD for:  difficulty breathing, headache or visual disturbances   Complete by: As directed    Call MD for:  persistant nausea and vomiting   Complete by: As directed    Call MD for:  redness, tenderness, or signs of infection (pain, swelling, redness, odor or green/yellow discharge around incision site)   Complete by: As directed    Call MD for:  severe uncontrolled pain   Complete by: As directed    Call MD for:  temperature >100.4   Complete by: As directed    Diet - low sodium heart healthy   Complete by: As directed    Discharge instructions   Complete by: As directed    Call office with any concerns [phone]   Increase activity slowly   Complete by: As directed       Follow-up Information    Schedule an appointment as soon as possible for a visit with Sherlyn Hay, DO.   Specialty: Obstetrics and Gynecology Why: 7-10days for post op  visit Contact information: [place]  [address] [phone]           Signed: Isaiah Serge 08/12/2018, 10:20 AM

## 2018-08-12 NOTE — Anesthesia Preprocedure Evaluation (Signed)
Anesthesia Evaluation   Patient awake    Airway Mallampati: II  TM Distance: >3 FB     Dental   Pulmonary    breath sounds clear to auscultation       Cardiovascular negative cardio ROS   Rhythm:Regular Rate:Normal     Neuro/Psych    GI/Hepatic negative GI ROS, Neg liver ROS,   Endo/Other  negative endocrine ROS  Renal/GU negative Renal ROS     Musculoskeletal   Abdominal   Peds  Hematology   Anesthesia Other Findings   Reproductive/Obstetrics                             Anesthesia Physical Anesthesia Plan  ASA: II  Anesthesia Plan: General   Post-op Pain Management:    Induction: Intravenous  PONV Risk Score and Plan: 3 and Ondansetron, Dexamethasone and Midazolam  Airway Management Planned: Oral ETT  Additional Equipment:   Intra-op Plan:   Post-operative Plan: Possible Post-op intubation/ventilation  Informed Consent: I have reviewed the patients History and Physical, chart, labs and discussed the procedure including the risks, benefits and alternatives for the proposed anesthesia with the patient or authorized representative who has indicated his/her understanding and acceptance.     Dental advisory given  Plan Discussed with: CRNA and Anesthesiologist  Anesthesia Plan Comments:         Anesthesia Quick Evaluation

## 2018-08-12 NOTE — Transfer of Care (Signed)
Immediate Anesthesia Transfer of Care Note  Patient: Dana Smith  Procedure(s) Performed: LAPAROSCOPY DIAGNOSTIC, LEFT SALPINGECTOMY WITH REMOVAL OF ECTOPIC PREGNANCY (Left )  Patient Location: PACU  Anesthesia Type:General  Level of Consciousness: sedated, drowsy, patient cooperative and responds to stimulation  Airway & Oxygen Therapy: Patient Spontanous Breathing and Patient connected to nasal cannula oxygen  Post-op Assessment: Report given to RN, Post -op Vital signs reviewed and stable and Patient moving all extremities X 4  Post vital signs: Reviewed and stable  Last Vitals:  Vitals Value Taken Time  BP 84/65 08/12/18 0300  Temp    Pulse 107 08/12/18 0301  Resp 16 08/12/18 0301  SpO2 97 % 08/12/18 0301  Vitals shown include unvalidated device data.  Last Pain:  Vitals:   08/11/18 2135  TempSrc:   PainSc: 4          Complications: No apparent anesthesia complications

## 2018-08-12 NOTE — Progress Notes (Signed)
Dana Smith is a 33 y.o. female patient admitted from PACU awake, alert - oriented  X 4 - no acute distress noted.  VSS - Blood pressure 111/71, pulse 72, temperature 98.7 F (37.1 C), temperature source Oral, resp. rate 18, height 5\' 6"  (1.676 m), weight 116.6 kg, last menstrual period 05/14/2018, SpO2 97 %.    IV in place, occlusive dsg intact without redness.    Will cont to eval and treat per MD orders.  Vidal Schwalbe, RN 08/12/2018 4:24 AM

## 2018-08-12 NOTE — Op Note (Signed)
Operative Note    Preoperative Diagnosis: 1. Acute abdomen   Postoperative Diagnosis 1. Acute abdomen                                            2. Ruptured ectopic - left fallopian tube   Procedure: Diagnostic laparoscopy, evacuation of clots and products of conception, left salpingectomy and removal of ectopic   Surgeon: Mickle Mallory DO  Anesthesia: General  Fluids: LR 1317ml EBL: 534ml UOP: 347ml 2uprbcs transfused  Findings: Ruptured ectopic from left fallopian tube, moderate to large amount of blood/clots in pelvis; grossly normal uterus, bilateral ovaries and right fallopian tubes   Specimen: left fallopian tube and ectopic pregnancy   Procedure Note  Consent was verified with pt.  Patient was taken to the operating room. She was placed in the dorsal supine position with her arms tucked at her sides. General anesthesia was administered and found to be adequate. She was then placed in the dorsal lithotomy position and prepped and draped in the normal sterile fashion. An appropriate timeout was performed. A speculum was then placed in the vagina and a hulka uterine manipulator placed. The bladder was emptied. Attention was then turned to the patient's abdomen after draping where the infraumbilical area was injected with approximately 5cc of quarter percent Marcaine. A 5 mm incision was then made within the umbilicus and the optiview trocar and camera easily introduced into the abdominal cavity with the abdomen tented upwards.Pneumoperitoneum obtained with approximate 3 L of CO2 gas. A 50mm scope and a second 66mm trocar were  placed in the patients left lower and right lower uterine quadrants respectively under direct visualization.   With patient in Trendelenburg, the pelvic cavity was noted to have a moderate to large amount of blood and clots. Once these had been evacuated with the suction, the left fallopian tube was noted to have a large extruding mass from its mid isthmus ruptured  region. There was still a small amount of bleeding noted. The right fallopian tube and both ovaries were without injury and appeared grossly normal. The left fallopian tube, with the apparent ectopic pregnancy, was excised hemostatically using there harmonic scalpel. The tube and ectopic were placed in a bag and removed to be be sent to pathology. Copious irrigation of the pelvic cavity was performed. Hemostasis was confirmed.Thus, at the point the pt was flattened. The 45mm port site was closed using a carter thomasen closure under direct visualization.. All trocars were then removed. Pneumoperitoneum was reduced. The incision sites were closed with 4-0 vicryl suture and honeycomb dressing. The hulka tenaculum was removed with hemostatic cervix noted.  Patient was then awakened and taken to the recovery room in good condition. Counts were all confirmed per nursing x 2

## 2018-08-12 NOTE — Discharge Instructions (Signed)
Call offie with any concerns [phone]  Acute Pain, Adult Acute pain is a type of pain that may last for just a few days or as long as six months. It is often related to an illness, injury, or medical procedure. Acute pain may be mild, moderate, or severe. It usually goes away once your injury has healed or you are no longer ill. Pain can make it hard for you to do daily activities. It can cause anxiety and lead to other problems if left untreated. Treatment depends on the cause and severity of your acute pain. Follow these instructions at home:  Check your pain level as told by your health care provider.  Take over-the-counter and prescription medicines only as told by your health care provider.  If you are taking prescription pain medicine: ? Ask your health care provider about taking a stool softener or laxative to prevent constipation. ? Do not stop taking the medicine suddenly. Talk to your health care provider about how and when to discontinue prescription pain medicine. ? If your pain is severe, do not take more pills than instructed by your health care provider. ? Do not take other over-the-counter pain medicines in addition to this medicine unless told by your health care provider. ? Do not drive or operate heavy machinery while taking prescription pain medicine.  Apply ice or heat as told by your health care provider. These may reduce swelling and pain.  Ask your health care provider if other strategies such as distraction, relaxation, or physical therapies can help your pain.  Keep all follow-up visits as told by your health care provider. This is important. Contact a health care provider if:  You have pain that is not controlled by medicine.  Your pain does not improve or gets worse.  You have side effects from pain medicines, such as vomitingor confusion. Get help right away if:  You have severe pain.  You have trouble breathing.  You lose consciousness.  You  have chest pain or pressure that lasts for more than a few minutes. Along with the chest pain you may: ? Have pain or discomfort in one or both arms, your back, neck, jaw, or stomach. ? Have shortness of breath. ? Break out in a cold sweat. ? Feel nauseous. ? Become light-headed. These symptoms may represent a serious problem that is an emergency. Do not wait to see if the symptoms will go away. Get medical help right away. Call your local emergency services (911 in the [place] Do not drive yourself to the hospital. This information is not intended to replace advice given to you by your health care provider. Make sure you discuss any questions you have with your health care provider. Document Released: 02/23/2015 Document Revised: 07/18/2015 Document Reviewed: 02/23/2015 Elsevier Interactive Patient Education  2019 [employer].

## 2018-08-12 NOTE — Progress Notes (Signed)
Patient ID: Dana Smith, female   DOB: 1986/02/23, 33 y.o.   MRN: [id number] Pt doing well at this time. Reports mild pain at incision site but significantly better pain relief since prior to surgery. She has tolerated breakfast this am. No fever, chills. Some fatigue.  VSS ABD - soft, mild tenderness as expected, dressing c/d/i EXT - no homans  12.9>11<259  A/P: POD#0 s/p dx laparoscopy with left salpingectomy due to ruptured ectopic         Ok to discharge pt to home after lunch if tolerates well         F/U in office in 7-10 days for post op visit

## 2018-08-12 NOTE — Progress Notes (Signed)
Patient discharged to home. Verbalized understanding of all discharge instructions including incision care, discharge medications and follow up MD visits. 

## 2018-08-13 LAB — BPAM RBC
Blood Product Expiration Date: 202007142359
Blood Product Expiration Date: 202007162359
ISSUE DATE / TIME: 202006200116
ISSUE DATE / TIME: 202006200116
Unit Type and Rh: 5100
Unit Type and Rh: 5100

## 2018-08-13 LAB — URINE CULTURE: Culture: NO GROWTH

## 2018-08-13 LAB — TYPE AND SCREEN
ABO/RH(D): O POS
Antibody Screen: NEGATIVE
Unit division: 0
Unit division: 0

## 2018-08-14 LAB — GC/CHLAMYDIA PROBE AMP (~~LOC~~) NOT AT ARMC
Chlamydia: NEGATIVE
Neisseria Gonorrhea: NEGATIVE

## 2018-08-14 LAB — POCT I-STAT 4, (NA,K, GLUC, HGB,HCT)
Glucose, Bld: 122 mg/dL — ABNORMAL HIGH (ref 70–99)
HCT: 26 % — ABNORMAL LOW (ref 36.0–46.0)
Hemoglobin: 8.8 g/dL — ABNORMAL LOW (ref 12.0–15.0)
Potassium: 4.5 mmol/L (ref 3.5–5.1)
Sodium: 136 mmol/L (ref 135–145)

## 2018-12-11 LAB — OB RESULTS CONSOLE GC/CHLAMYDIA
Chlamydia: NEGATIVE
Gonorrhea: NEGATIVE

## 2018-12-11 LAB — OB RESULTS CONSOLE RPR: RPR: NONREACTIVE

## 2018-12-11 LAB — OB RESULTS CONSOLE ANTIBODY SCREEN: Antibody Screen: NEGATIVE

## 2018-12-11 LAB — OB RESULTS CONSOLE ABO/RH: RH Type: POSITIVE

## 2018-12-11 LAB — OB RESULTS CONSOLE HEPATITIS B SURFACE ANTIGEN: Hepatitis B Surface Ag: NEGATIVE

## 2018-12-11 LAB — OB RESULTS CONSOLE RUBELLA ANTIBODY, IGM: Rubella: IMMUNE

## 2018-12-11 LAB — OB RESULTS CONSOLE HIV ANTIBODY (ROUTINE TESTING): HIV: NONREACTIVE

## 2019-01-10 ENCOUNTER — Other Ambulatory Visit: Payer: Self-pay

## 2019-01-10 DIAGNOSIS — Z20822 Contact with and (suspected) exposure to covid-19: Secondary | ICD-10-CM

## 2019-01-12 LAB — NOVEL CORONAVIRUS, NAA: SARS-CoV-2, NAA: NOT DETECTED

## 2019-04-10 LAB — OB RESULTS CONSOLE HIV ANTIBODY (ROUTINE TESTING): HIV: NONREACTIVE

## 2019-04-10 LAB — OB RESULTS CONSOLE RPR: RPR: NONREACTIVE

## 2019-06-07 ENCOUNTER — Ambulatory Visit: Payer: Self-pay | Attending: Internal Medicine

## 2019-06-07 DIAGNOSIS — Z23 Encounter for immunization: Secondary | ICD-10-CM

## 2019-06-07 NOTE — Progress Notes (Signed)
   Covid-19 Vaccination Clinic  Name:  Dana Smith    MRN: [id number] DOB: 04-05-1985  06/07/2019  [name] was observed post Covid-19 immunization for 15 minutes without incident. She was provided with Vaccine Information Sheet and instruction to access the V-Safe system.   [name] was instructed to call 911 with any severe reactions post vaccine: Marland Kitchen Difficulty breathing  . Swelling of face and throat  . A fast heartbeat  . A bad rash all over body  . Dizziness and weakness   Immunizations Administered    Name Date Dose VIS Date Route   Pfizer COVID-19 Vaccine 06/07/2019 11:00 AM 0.3 mL 02/02/2019 Intramuscular   Manufacturer: [place]   Lot: [id number]   Advance: [id number]

## 2019-06-11 ENCOUNTER — Other Ambulatory Visit: Payer: Self-pay

## 2019-06-11 ENCOUNTER — Ambulatory Visit (INDEPENDENT_AMBULATORY_CARE_PROVIDER_SITE_OTHER): Payer: No Typology Code available for payment source | Admitting: Pediatrics

## 2019-06-11 DIAGNOSIS — Z7681 Expectant parent(s) prebirth pediatrician visit: Secondary | ICD-10-CM

## 2019-06-12 LAB — OB RESULTS CONSOLE GBS: GBS: POSITIVE

## 2019-06-13 NOTE — Progress Notes (Signed)
Prenatal counseling for impending newborn done--  Reviewed vaccine policy.  1st child, Currently 36 weeks, Current complications none, unable to complete glucagon test, concern for GDM so checking frequent BG , Prenatal care initiated early Z76.81

## 2019-06-15 ENCOUNTER — Institutional Professional Consult (permissible substitution): Payer: Self-pay | Admitting: Pediatrics

## 2019-06-20 ENCOUNTER — Encounter (HOSPITAL_COMMUNITY): Payer: Self-pay | Admitting: *Deleted

## 2019-06-20 ENCOUNTER — Telehealth (HOSPITAL_COMMUNITY): Payer: Self-pay | Admitting: *Deleted

## 2019-06-20 NOTE — Telephone Encounter (Signed)
Preadmission screen  

## 2019-06-21 ENCOUNTER — Telehealth (HOSPITAL_COMMUNITY): Payer: Self-pay | Admitting: *Deleted

## 2019-06-21 NOTE — Telephone Encounter (Signed)
Preadmission screen  

## 2019-06-22 ENCOUNTER — Other Ambulatory Visit (HOSPITAL_COMMUNITY)
Admission: RE | Admit: 2019-06-22 | Discharge: 2019-06-22 | Disposition: A | Discharge status: Home / Self Care | Payer: No Typology Code available for payment source | Source: Ambulatory Visit | Attending: Obstetrics and Gynecology | Admitting: Obstetrics and Gynecology

## 2019-06-22 ENCOUNTER — Encounter (HOSPITAL_COMMUNITY): Payer: Self-pay | Admitting: *Deleted

## 2019-06-22 ENCOUNTER — Telehealth (HOSPITAL_COMMUNITY): Payer: Self-pay | Admitting: *Deleted

## 2019-06-22 DIAGNOSIS — Z20822 Contact with and (suspected) exposure to covid-19: Secondary | ICD-10-CM | POA: Insufficient documentation

## 2019-06-22 DIAGNOSIS — Z01812 Encounter for preprocedural laboratory examination: Secondary | ICD-10-CM | POA: Insufficient documentation

## 2019-06-22 LAB — SARS CORONAVIRUS 2 (TAT 6-24 HRS): SARS Coronavirus 2: NEGATIVE

## 2019-06-22 NOTE — Telephone Encounter (Signed)
Preadmission screen  

## 2019-06-24 ENCOUNTER — Inpatient Hospital Stay (HOSPITAL_COMMUNITY): Payer: No Typology Code available for payment source | Admitting: Anesthesiology

## 2019-06-24 ENCOUNTER — Encounter (HOSPITAL_COMMUNITY): Payer: Self-pay | Admitting: Obstetrics and Gynecology

## 2019-06-24 ENCOUNTER — Inpatient Hospital Stay (HOSPITAL_COMMUNITY): Payer: No Typology Code available for payment source

## 2019-06-24 ENCOUNTER — Inpatient Hospital Stay (HOSPITAL_COMMUNITY)
Admission: AD | Admit: 2019-06-24 | Discharge: 2019-06-27 | DRG: 787 | Disposition: A | Discharge status: Home / Self Care | Payer: No Typology Code available for payment source | Attending: Obstetrics and Gynecology | Admitting: Obstetrics and Gynecology

## 2019-06-24 ENCOUNTER — Other Ambulatory Visit: Payer: Self-pay

## 2019-06-24 DIAGNOSIS — Z3A00 Weeks of gestation of pregnancy not specified: Secondary | ICD-10-CM

## 2019-06-24 DIAGNOSIS — O24419 Gestational diabetes mellitus in pregnancy, unspecified control: Secondary | ICD-10-CM

## 2019-06-24 DIAGNOSIS — O99824 Streptococcus B carrier state complicating childbirth: Secondary | ICD-10-CM | POA: Diagnosis present

## 2019-06-24 DIAGNOSIS — O9081 Anemia of the puerperium: Secondary | ICD-10-CM | POA: Diagnosis not present

## 2019-06-24 DIAGNOSIS — D62 Acute posthemorrhagic anemia: Secondary | ICD-10-CM | POA: Diagnosis not present

## 2019-06-24 DIAGNOSIS — O24425 Gestational diabetes mellitus in childbirth, controlled by oral hypoglycemic drugs: Principal | ICD-10-CM | POA: Diagnosis present

## 2019-06-24 DIAGNOSIS — Z3A38 38 weeks gestation of pregnancy: Secondary | ICD-10-CM

## 2019-06-24 DIAGNOSIS — O329XX0 Maternal care for malpresentation of fetus, unspecified, not applicable or unspecified: Secondary | ICD-10-CM

## 2019-06-24 DIAGNOSIS — Z20822 Contact with and (suspected) exposure to covid-19: Secondary | ICD-10-CM | POA: Diagnosis present

## 2019-06-24 DIAGNOSIS — O99214 Obesity complicating childbirth: Secondary | ICD-10-CM | POA: Diagnosis present

## 2019-06-24 LAB — COMPREHENSIVE METABOLIC PANEL
ALT: 20 U/L (ref 0–44)
AST: 20 U/L (ref 15–41)
Albumin: 2.3 g/dL — ABNORMAL LOW (ref 3.5–5.0)
Alkaline Phosphatase: 154 U/L — ABNORMAL HIGH (ref 38–126)
Anion gap: 8 (ref 5–15)
BUN: 9 mg/dL (ref 6–20)
CO2: 19 mmol/L — ABNORMAL LOW (ref 22–32)
Calcium: 8.8 mg/dL — ABNORMAL LOW (ref 8.9–10.3)
Chloride: 109 mmol/L (ref 98–111)
Creatinine, Ser: 0.7 mg/dL (ref 0.44–1.00)
GFR calc Af Amer: >60 mL/min (ref >60)
GFR calc non Af Amer: >60 mL/min (ref >60)
Glucose, Bld: 115 mg/dL — ABNORMAL HIGH (ref 70–99)
Potassium: 3.5 mmol/L (ref 3.5–5.1)
Sodium: 136 mmol/L (ref 135–145)
Total Bilirubin: 0.3 mg/dL (ref 0.3–1.2)
Total Protein: 5.7 g/dL — ABNORMAL LOW (ref 6.5–8.1)

## 2019-06-24 LAB — CBC
HCT: 31.4 % — ABNORMAL LOW (ref 36.0–46.0)
Hemoglobin: 10.4 g/dL — ABNORMAL LOW (ref 12.0–15.0)
MCH: 29.3 pg (ref 26.0–34.0)
MCHC: 33.1 g/dL (ref 30.0–36.0)
MCV: 88.5 fL (ref 80.0–100.0)
Platelets: 268 10*3/uL (ref 150–400)
RBC: 3.55 MIL/uL — ABNORMAL LOW (ref 3.87–5.11)
RDW: 13.8 % (ref 11.5–15.5)
WBC: 11.6 10*3/uL — ABNORMAL HIGH (ref 4.0–10.5)
nRBC: 0 % (ref 0.0–0.2)

## 2019-06-24 LAB — TYPE AND SCREEN
ABO/RH(D): O POS
Antibody Screen: NEGATIVE

## 2019-06-24 LAB — GLUCOSE, CAPILLARY
Glucose-Capillary: 66 mg/dL — ABNORMAL LOW (ref 70–99)
Glucose-Capillary: 71 mg/dL (ref 70–99)
Glucose-Capillary: 75 mg/dL (ref 70–99)
Glucose-Capillary: 97 mg/dL (ref 70–99)

## 2019-06-24 LAB — RPR: RPR Ser Ql: NONREACTIVE

## 2019-06-24 MED ORDER — FENTANYL-BUPIVACAINE-NACL 0.5-0.125-0.9 MG/250ML-% EP SOLN
12.0000 mL/h | EPIDURAL | Status: DC | PRN
  Filled 2019-06-24: qty 250

## 2019-06-24 MED ORDER — SOD CITRATE-CITRIC ACID 500-334 MG/5ML PO SOLN
30.0000 mL | ORAL | Status: DC | PRN
  Administered 2019-06-25: 06:00:00 30 mL via ORAL
  Filled 2019-06-24: qty 30

## 2019-06-24 MED ORDER — OXYCODONE-ACETAMINOPHEN 5-325 MG PO TABS: 1.0000 | ORAL_TABLET | ORAL | Status: DC | PRN

## 2019-06-24 MED ORDER — LACTATED RINGERS IV SOLN: 500.0000 mL | INTRAVENOUS | Status: DC | PRN

## 2019-06-24 MED ORDER — PHENYLEPHRINE 40 MCG/ML (10ML) SYRINGE FOR IV PUSH (FOR BLOOD PRESSURE SUPPORT)
80.0000 ug | PREFILLED_SYRINGE | INTRAVENOUS | Status: DC | PRN
  Filled 2019-06-24: qty 10

## 2019-06-24 MED ORDER — OXYTOCIN BOLUS FROM INFUSION: 500.0000 mL | Freq: Once | INTRAVENOUS | Status: DC

## 2019-06-24 MED ORDER — OXYTOCIN 40 UNITS IN NORMAL SALINE INFUSION - SIMPLE MED: 2.5000 [IU]/h | INTRAVENOUS | Status: DC

## 2019-06-24 MED ORDER — OXYTOCIN 40 UNITS IN NORMAL SALINE INFUSION - SIMPLE MED
1.0000 m[IU]/min | INTRAVENOUS | Status: DC
  Administered 2019-06-24: 09:00:00 2 m[IU]/min via INTRAVENOUS
  Filled 2019-06-24: qty 1000

## 2019-06-24 MED ORDER — TERBUTALINE SULFATE 1 MG/ML IJ SOLN: 0.2500 mg | Freq: Once | INTRAMUSCULAR | Status: DC | PRN

## 2019-06-24 MED ORDER — SODIUM CHLORIDE 0.9 % IV SOLN
5.0000 10*6.[IU] | Freq: Once | INTRAVENOUS | Status: AC
  Administered 2019-06-24: 01:00:00 5 10*6.[IU] via INTRAVENOUS
  Filled 2019-06-24: qty 5

## 2019-06-24 MED ORDER — LIDOCAINE HCL (PF) 1 % IJ SOLN
30.0000 mL | INTRAMUSCULAR | Status: AC | PRN
  Administered 2019-06-24: 7 mL via SUBCUTANEOUS
  Administered 2019-06-24: 5 mL via SUBCUTANEOUS

## 2019-06-24 MED ORDER — DIPHENHYDRAMINE HCL 50 MG/ML IJ SOLN
12.5000 mg | INTRAMUSCULAR | Status: DC | PRN
  Administered 2019-06-25 (×2): 12.5 mg via INTRAVENOUS
  Filled 2019-06-24 (×2): qty 1

## 2019-06-24 MED ORDER — EPHEDRINE 5 MG/ML INJ: 10.0000 mg | INTRAVENOUS | Status: DC | PRN

## 2019-06-24 MED ORDER — ONDANSETRON HCL 4 MG/2ML IJ SOLN
4.0000 mg | Freq: Four times a day (QID) | INTRAMUSCULAR | Status: DC | PRN
  Administered 2019-06-24: 17:00:00 4 mg via INTRAVENOUS
  Filled 2019-06-24: qty 2

## 2019-06-24 MED ORDER — LACTATED RINGERS IV SOLN: INTRAVENOUS | Status: DC

## 2019-06-24 MED ORDER — OXYCODONE-ACETAMINOPHEN 5-325 MG PO TABS: 2.0000 | ORAL_TABLET | ORAL | Status: DC | PRN

## 2019-06-24 MED ORDER — PENICILLIN G POT IN DEXTROSE 60000 UNIT/ML IV SOLN
3.0000 10*6.[IU] | INTRAVENOUS | Status: DC
  Administered 2019-06-24 – 2019-06-25 (×6): 3 10*6.[IU] via INTRAVENOUS
  Filled 2019-06-24 (×6): qty 50

## 2019-06-24 MED ORDER — SODIUM CHLORIDE (PF) 0.9 % IJ SOLN
INTRAMUSCULAR | Status: DC | PRN
  Administered 2019-06-24: 12 mL/h via EPIDURAL

## 2019-06-24 MED ORDER — ACETAMINOPHEN 325 MG PO TABS: 650.0000 mg | ORAL_TABLET | ORAL | Status: DC | PRN

## 2019-06-24 MED ORDER — BUTORPHANOL TARTRATE 1 MG/ML IJ SOLN
1.0000 mg | INTRAMUSCULAR | Status: DC | PRN
  Administered 2019-06-24: 12:00:00 1 mg via INTRAVENOUS
  Filled 2019-06-24: qty 1

## 2019-06-24 MED ORDER — MISOPROSTOL 25 MCG QUARTER TABLET
25.0000 ug | ORAL_TABLET | ORAL | Status: DC | PRN
  Administered 2019-06-24 (×2): 25 ug via VAGINAL
  Filled 2019-06-24 (×3): qty 1

## 2019-06-24 MED ORDER — LACTATED RINGERS IV SOLN: 500.0000 mL | Freq: Once | INTRAVENOUS | Status: DC

## 2019-06-24 MED ORDER — PHENYLEPHRINE 40 MCG/ML (10ML) SYRINGE FOR IV PUSH (FOR BLOOD PRESSURE SUPPORT): 80.0000 ug | PREFILLED_SYRINGE | INTRAVENOUS | Status: DC | PRN

## 2019-06-24 NOTE — Progress Notes (Signed)
Patient ID: Dana Smith, female   DOB: [birth year], 34 y.o.   MRN: [id number] Pt doing well. She is comfortable with epidural. She denies any concerns VSS GEN - NAD EFM - cat 1, 120s TOCO - contractions irregular and hard to pick up SVE - 5.5/70/-3  A/P: Progressing in labor on pitocin; now at 10mus         IUPC and FSE placed          Check prn

## 2019-06-24 NOTE — Progress Notes (Signed)
Patient ID: Dana Smith, female   DOB: February 18, 1986, 34 y.o.   MRN: [id number] Pt doing well. She reports appreciating some contractions but only "feels like bad gas" at this time. +Fms, no LOC. No complaints VSS - 99-144/63-83, 64 GEN - NAD EFM - cat 1; 140 TOCO - irreg contractions q 1-15mins SVE - unchanged at 1.5cm high  FSBS 97  A/P: 34yo G2P0010 at 38 0/7wks with GDMA2 - stable         - S/P cytotec x 2         - Start on pitocin in an hour when protocol allows         - AROM when able         - Pain control prn         - Continue current care

## 2019-06-24 NOTE — H&P (Signed)
Dana Smith is a 34 y.o. G104P0010  female presenting for scheduled inductions of labor. Pt is dated per a 6 week [place] that matched her LMP. Her pregnancy has been complicated by AMA and also development of GDM. Pt was managed on metformin but her fasting levels have not been adequately controlled; postprandials have. She is GBS positive with no allergy to PCN. She is sars covid neg. She had a benign course otherwise. . OB History    Gravida  2   Para      Term      Preterm      AB  1   Living  0     SAB  0   TAB      Ectopic  1   Multiple      Live Births  0          Past Medical History:  Diagnosis Date  . Fibroid   . Gestational diabetes    metformin   Past Surgical History:  Procedure Laterality Date  . LAPAROSCOPY Left 08/11/2018   Procedure: LAPAROSCOPY DIAGNOSTIC, LEFT SALPINGECTOMY WITH REMOVAL OF ECTOPIC PREGNANCY;  Surgeon: Sherlyn Hay, DO;  Location: [facility];  Service: Gynecology;  Laterality: Left;  . WISDOM TOOTH EXTRACTION Bilateral    Family History: family history includes Healthy in her father and mother. Social History:  reports that she has never smoked. She has never used smokeless tobacco. She reports previous alcohol use. She reports that she does not use drugs.     Maternal Diabetes: Yes:  Diabetes Type:  Insulin/Medication controlled Genetic Screening: Normal Maternal Ultrasounds/Referrals: Normal Fetal Ultrasounds or other Referrals:  None Maternal Substance Abuse:  No Significant Maternal Medications:  Meds include: Other: metformin Significant Maternal Lab Results:  Group B Strep positive Other Comments:  None  Review of Systems  Constitutional: Positive for fatigue. Negative for activity change, appetite change and diaphoresis.  Eyes: Negative for visual disturbance.  Respiratory: Negative for chest tightness and shortness of breath.   Cardiovascular: Positive for leg swelling. Negative for chest pain and  palpitations.  Gastrointestinal: Negative for abdominal pain, nausea and vomiting.  Genitourinary: Negative for dysuria.  Musculoskeletal: Positive for myalgias.  Neurological: Negative for headaches.  Psychiatric/Behavioral: The patient is not nervous/anxious.    Maternal Medical History:  Reason for admission: Nausea. Induction of labor for uncontrolled GDMA2  Contractions: Onset was 3-5 hours ago.   Frequency: irregular.   Perceived severity is mild.    Fetal activity: Perceived fetal activity is normal.   Last perceived fetal movement was within the past hour.    Prenatal complications: no prenatal complications Prenatal Complications - Diabetes: gestational. Diabetes is managed by oral agent (monotherapy).      Dilation: 1.5 Effacement (%): Thick Station: -3 Exam by:: Braydee Shimkus MD Blood pressure (!) 144/82, pulse 64, temperature 98.1 F (36.7 C), temperature source Oral, resp. rate 16, height 5\' 6"  (1.676 m), weight 130.6 kg, last menstrual period 10/01/2018. Maternal Exam:  Uterine Assessment: Contraction strength is mild.  Contraction frequency is irregular.   Abdomen: Patient reports generalized tenderness.  Estimated fetal weight is AGA.   Fetal presentation: vertex  Introitus: Normal vulva. Vulva is negative for condylomata and lesion.  Normal vagina.  Vagina is negative for condylomata and discharge.  Pelvis: adequate for delivery.   Cervix: Cervix evaluated by digital exam.     Fetal Exam Fetal Monitor Review: Baseline rate: 140.  Variability: moderate (6-25 bpm).   Pattern: accelerations present  and no decelerations.    Fetal State Assessment: Category I - tracings are normal.     Physical Exam  Constitutional: She appears well-developed and well-nourished.  Cardiovascular: Normal rate and intact distal pulses.  Respiratory: Effort normal.  GI: Soft. There is generalized abdominal tenderness.  Genitourinary:    Vulva, vagina and uterus normal.      No vulval condylomata or lesion noted.     No vaginal discharge.   Musculoskeletal:        General: Normal range of motion.     Cervical back: Normal range of motion.  Neurological: She is alert.  Skin: Skin is warm.  Psychiatric: She has a normal mood and affect. Her behavior is normal. Judgment and thought content normal.    Prenatal labs: ABO, Rh: --/--/O POS (05/02 0020) Antibody: NEG (05/02 0020) Rubella: Immune (10/19 0000) RPR: Nonreactive (02/16 0000)  HBsAg: Negative (10/19 0000)  HIV: Non-reactive (02/16 0000)  GBS: Positive/-- (04/20 0000)   Assessment/Plan: [id number] female here for induction of labor due to inadequately controlled GDMA2 - Admit - Cytotec for ripening - Sars covid neg - GBS pos - treat with PCN in labor - Pain control prn   Dana Smith Dana Smith 06/24/2019, 8:09 AM

## 2019-06-24 NOTE — Progress Notes (Signed)
Hypoglycemic Event  CBG: 66  Treatment: saltine crackers and peanut butter with MD approval  Symptoms: none  Follow-up CBG: Time: 2103 CBG Result: 71  Possible Reasons for Event: clear liquid diet, pt nauseated and not eating/drinking  Comments/MD notified: Dr. Terri Piedra notified    Dana Smith E Kinzi Frediani

## 2019-06-24 NOTE — Progress Notes (Signed)
Pt informed that the ultrasound is considered a limited OB ultrasound and is not intended to be a complete ultrasound exam.  Patient also informed that the ultrasound is not being completed with the intent of assessing for fetal or placental anomalies or any pelvic abnormalities.  Explained that the purpose of today's ultrasound is to assess for  presentation.  Patient acknowledges the purpose of the exam and the limitations of the study.    Baby vertex, LOT with longitudinal lie, spine maternal left  Dana Smith, [name], DO OB Fellow, [facility] 06/24/2019 1:18 AM

## 2019-06-24 NOTE — Anesthesia Procedure Notes (Signed)
Epidural Patient location during procedure: OB Start time: 06/24/2019 3:15 PM End time: 06/24/2019 3:17 PM  Staffing Anesthesiologist: Lyn Hollingshead, MD Performed: anesthesiologist   Preanesthetic Checklist Completed: patient identified, IV checked, site marked, risks and benefits discussed, surgical consent, monitors and equipment checked, pre-op evaluation and timeout performed  Epidural Patient position: sitting Prep: DuraPrep and site prepped and draped Patient monitoring: continuous pulse ox and blood pressure Approach: midline Location: L3-L4 Injection technique: LOR air  Needle:  Needle type: Tuohy  Needle gauge: 17 G Needle length: 9 cm and 9 Needle insertion depth: 7 cm Catheter type: closed end flexible Catheter size: 19 Gauge Catheter at skin depth: 12 cm Test dose: negative and Other  Assessment Events: blood not aspirated, injection not painful, no injection resistance, no paresthesia and negative IV test  Additional Notes Reason for block:procedure for pain

## 2019-06-24 NOTE — Progress Notes (Signed)
Patient ID: Dana Smith, female   DOB: 15-May-1985, 34 y.o.   MRN: [id number] Pt reports appreciating ctxs only every 15mins or so. No complaints VSS EFM - cat 1 at present with baseline of 120s; baseline was 140s before with ? decels TOCO - contractions irregularly q 1-41mins SVE - 1/thick and high Pitocin at 67mus  Bedside US confirms vertex  A/P: G2P0010 at 38 0/7wks in latent labor on pitocin         Counseled pt on using a foley catheter concurrently with pitocin. Explained physiologically how cooks catheter can help with mechanically dilating cervix          Catheter placed with 60u/40v: pt tolerated well          Pt may have pain medication prn          Will monitor heart tones prn; will keep pitocin at 40mus          F/U prn

## 2019-06-24 NOTE — Anesthesia Preprocedure Evaluation (Signed)
Anesthesia Evaluation  Patient identified by MRN, date of birth, ID band Patient awake    Reviewed: Allergy & Precautions, H&P , NPO status , Patient's Chart, lab work & pertinent test results  Airway Mallampati: III  TM Distance: >3 FB Neck ROM: full    Dental no notable dental hx. (+) Teeth Intact   Pulmonary neg pulmonary ROS,    Pulmonary exam normal breath sounds clear to auscultation       Cardiovascular negative cardio ROS Normal cardiovascular exam Rhythm:regular Rate:Normal     Neuro/Psych negative neurological ROS  negative psych ROS   GI/Hepatic negative GI ROS, Neg liver ROS,   Endo/Other  diabetes, Gestational, Oral Hypoglycemic AgentsMorbid obesity  Renal/GU negative Renal ROS  negative genitourinary   Musculoskeletal negative musculoskeletal ROS (+)   Abdominal (+) + obese,   Peds  Hematology negative hematology ROS (+)   Anesthesia Other Findings   Reproductive/Obstetrics (+) Pregnancy                             Anesthesia Physical Anesthesia Plan  ASA: III  Anesthesia Plan: Epidural   Post-op Pain Management:    Induction:   PONV Risk Score and Plan:   Airway Management Planned:   Additional Equipment:   Intra-op Plan:   Post-operative Plan:   Informed Consent: I have reviewed the patients History and Physical, chart, labs and discussed the procedure including the risks, benefits and alternatives for the proposed anesthesia with the patient or authorized representative who has indicated his/her understanding and acceptance.       Plan Discussed with:   Anesthesia Plan Comments:         Anesthesia Quick Evaluation

## 2019-06-25 ENCOUNTER — Encounter (HOSPITAL_COMMUNITY)
Admission: AD | Disposition: A | Discharge status: Home / Self Care | Payer: Self-pay | Source: Home / Self Care | Attending: Obstetrics and Gynecology

## 2019-06-25 ENCOUNTER — Encounter (HOSPITAL_COMMUNITY): Payer: Self-pay | Admitting: Obstetrics and Gynecology

## 2019-06-25 LAB — GLUCOSE, CAPILLARY
Glucose-Capillary: 100 mg/dL — ABNORMAL HIGH (ref 70–99)
Glucose-Capillary: 100 mg/dL — ABNORMAL HIGH (ref 70–99)
Glucose-Capillary: 119 mg/dL — ABNORMAL HIGH (ref 70–99)

## 2019-06-25 SURGERY — Surgical Case
Anesthesia: Epidural | Site: Abdomen | Wound class: Clean Contaminated

## 2019-06-25 MED ORDER — FENTANYL CITRATE (PF) 100 MCG/2ML IJ SOLN
INTRAMUSCULAR | Status: DC | PRN
  Administered 2019-06-25: 100 ug via EPIDURAL

## 2019-06-25 MED ORDER — PROPOFOL 10 MG/ML IV BOLUS
INTRAVENOUS | Status: AC
  Filled 2019-06-25: qty 20

## 2019-06-25 MED ORDER — DEXAMETHASONE SODIUM PHOSPHATE 4 MG/ML IJ SOLN
INTRAMUSCULAR | Status: AC
  Filled 2019-06-25: qty 1

## 2019-06-25 MED ORDER — DEXAMETHASONE SODIUM PHOSPHATE 4 MG/ML IJ SOLN
INTRAMUSCULAR | Status: DC | PRN
Start: 2019-06-25 — End: 2019-06-25
  Administered 2019-06-25: 4 mg via INTRAVENOUS

## 2019-06-25 MED ORDER — ONDANSETRON HCL 4 MG/2ML IJ SOLN
INTRAMUSCULAR | Status: DC | PRN
  Administered 2019-06-25: 4 mg via INTRAVENOUS

## 2019-06-25 MED ORDER — SIMETHICONE 80 MG PO CHEW
80.0000 mg | CHEWABLE_TABLET | ORAL | Status: DC
  Administered 2019-06-25 – 2019-06-26 (×2): 80 mg via ORAL
  Filled 2019-06-25 (×2): qty 1

## 2019-06-25 MED ORDER — STERILE WATER FOR IRRIGATION IR SOLN
Status: DC | PRN
  Administered 2019-06-25: 1

## 2019-06-25 MED ORDER — MORPHINE SULFATE (PF) 0.5 MG/ML IJ SOLN
INTRAMUSCULAR | Status: AC
  Filled 2019-06-25: qty 10

## 2019-06-25 MED ORDER — KETOROLAC TROMETHAMINE 30 MG/ML IJ SOLN
30.0000 mg | Freq: Once | INTRAMUSCULAR | Status: AC
  Administered 2019-06-25: 07:00:00 30 mg via INTRAVENOUS

## 2019-06-25 MED ORDER — OXYTOCIN 40 UNITS IN NORMAL SALINE INFUSION - SIMPLE MED: 2.5000 [IU]/h | INTRAVENOUS | Status: AC

## 2019-06-25 MED ORDER — MORPHINE SULFATE (PF) 0.5 MG/ML IJ SOLN
INTRAMUSCULAR | Status: DC | PRN
  Administered 2019-06-25: 3 mg via EPIDURAL

## 2019-06-25 MED ORDER — LIDOCAINE-EPINEPHRINE (PF) 2 %-1:200000 IJ SOLN
INTRAMUSCULAR | Status: AC
  Filled 2019-06-25: qty 10

## 2019-06-25 MED ORDER — NALOXONE HCL 4 MG/10ML IJ SOLN
1.0000 ug/kg/h | INTRAVENOUS | Status: DC | PRN
  Filled 2019-06-25: qty 5

## 2019-06-25 MED ORDER — FENTANYL CITRATE (PF) 100 MCG/2ML IJ SOLN: 25.0000 ug | INTRAMUSCULAR | Status: DC | PRN

## 2019-06-25 MED ORDER — ACETAMINOPHEN 160 MG/5ML PO SOLN: 1000.0000 mg | Freq: Once | ORAL | Status: DC

## 2019-06-25 MED ORDER — ZOLPIDEM TARTRATE 5 MG PO TABS: 5.0000 mg | ORAL_TABLET | Freq: Every evening | ORAL | Status: DC | PRN

## 2019-06-25 MED ORDER — KETOROLAC TROMETHAMINE 30 MG/ML IJ SOLN
30.0000 mg | Freq: Four times a day (QID) | INTRAMUSCULAR | Status: AC | PRN
  Administered 2019-06-25 – 2019-06-26 (×2): 30 mg via INTRAVENOUS
  Filled 2019-06-25: qty 1

## 2019-06-25 MED ORDER — SENNOSIDES-DOCUSATE SODIUM 8.6-50 MG PO TABS
2.0000 | ORAL_TABLET | ORAL | Status: DC
  Administered 2019-06-25 – 2019-06-26 (×2): 2 via ORAL
  Filled 2019-06-25 (×2): qty 2

## 2019-06-25 MED ORDER — ACETAMINOPHEN 500 MG PO TABS: 1000.0000 mg | ORAL_TABLET | Freq: Once | ORAL | Status: DC

## 2019-06-25 MED ORDER — TETANUS-DIPHTH-ACELL PERTUSSIS 5-2.5-18.5 LF-MCG/0.5 IM SUSP: 0.5000 mL | Freq: Once | INTRAMUSCULAR | Status: DC

## 2019-06-25 MED ORDER — OXYCODONE HCL 5 MG PO TABS
5.0000 mg | ORAL_TABLET | ORAL | Status: DC | PRN
  Administered 2019-06-26: 15:00:00 5 mg via ORAL
  Administered 2019-06-27: 12:00:00 10 mg via ORAL
  Administered 2019-06-27: 03:00:00 5 mg via ORAL
  Filled 2019-06-25: qty 1
  Filled 2019-06-25: qty 2
  Filled 2019-06-25: qty 1

## 2019-06-25 MED ORDER — MENTHOL 3 MG MT LOZG: 1.0000 | LOZENGE | OROMUCOSAL | Status: DC | PRN

## 2019-06-25 MED ORDER — NALBUPHINE HCL 10 MG/ML IJ SOLN: 5.0000 mg | Freq: Once | INTRAMUSCULAR | Status: DC | PRN

## 2019-06-25 MED ORDER — LACTATED RINGERS IV SOLN: INTRAVENOUS | Status: DC

## 2019-06-25 MED ORDER — FENTANYL CITRATE (PF) 100 MCG/2ML IJ SOLN
INTRAMUSCULAR | Status: AC
  Filled 2019-06-25: qty 2

## 2019-06-25 MED ORDER — SUCCINYLCHOLINE CHLORIDE 200 MG/10ML IV SOSY
PREFILLED_SYRINGE | INTRAVENOUS | Status: AC
  Filled 2019-06-25: qty 10

## 2019-06-25 MED ORDER — NALBUPHINE HCL 10 MG/ML IJ SOLN
5.0000 mg | INTRAMUSCULAR | Status: DC | PRN
  Administered 2019-06-25: 18:00:00 5 mg via INTRAVENOUS
  Filled 2019-06-25: qty 1

## 2019-06-25 MED ORDER — NALOXONE HCL 0.4 MG/ML IJ SOLN: 0.4000 mg | INTRAMUSCULAR | Status: DC | PRN

## 2019-06-25 MED ORDER — DIBUCAINE (PERIANAL) 1 % EX OINT: 1.0000 "application " | TOPICAL_OINTMENT | CUTANEOUS | Status: DC | PRN

## 2019-06-25 MED ORDER — KETOROLAC TROMETHAMINE 30 MG/ML IJ SOLN
INTRAMUSCULAR | Status: AC
  Filled 2019-06-25: qty 1

## 2019-06-25 MED ORDER — NALBUPHINE HCL 10 MG/ML IJ SOLN: 5.0000 mg | INTRAMUSCULAR | Status: DC | PRN

## 2019-06-25 MED ORDER — WITCH HAZEL-GLYCERIN EX PADS: 1.0000 "application " | MEDICATED_PAD | CUTANEOUS | Status: DC | PRN

## 2019-06-25 MED ORDER — DEXTROSE 5 % IV SOLN
INTRAVENOUS | Status: DC | PRN
  Administered 2019-06-25: 3 g via INTRAVENOUS

## 2019-06-25 MED ORDER — OXYTOCIN 40 UNITS IN NORMAL SALINE INFUSION - SIMPLE MED
INTRAVENOUS | Status: DC | PRN
  Administered 2019-06-25: 700 mL via INTRAVENOUS

## 2019-06-25 MED ORDER — PRENATAL MULTIVITAMIN CH
1.0000 | ORAL_TABLET | Freq: Every day | ORAL | Status: DC
  Administered 2019-06-25 – 2019-06-26 (×2): 1 via ORAL
  Filled 2019-06-25 (×2): qty 1

## 2019-06-25 MED ORDER — ONDANSETRON HCL 4 MG/2ML IJ SOLN
4.0000 mg | Freq: Three times a day (TID) | INTRAMUSCULAR | Status: DC | PRN
  Administered 2019-06-25: 16:00:00 4 mg via INTRAVENOUS
  Filled 2019-06-25: qty 2

## 2019-06-25 MED ORDER — SODIUM CHLORIDE 0.9 % IR SOLN
Status: DC | PRN
  Administered 2019-06-25: 1

## 2019-06-25 MED ORDER — COCONUT OIL OIL
1.0000 "application " | TOPICAL_OIL | Status: DC | PRN
  Administered 2019-06-26: 1 via TOPICAL

## 2019-06-25 MED ORDER — LACTATED RINGERS IV SOLN: INTRAVENOUS | Status: DC | PRN

## 2019-06-25 MED ORDER — ACETAMINOPHEN 500 MG PO TABS
1000.0000 mg | ORAL_TABLET | Freq: Four times a day (QID) | ORAL | Status: AC
  Administered 2019-06-25 (×2): 1000 mg via ORAL
  Filled 2019-06-25 (×2): qty 2

## 2019-06-25 MED ORDER — IBUPROFEN 800 MG PO TABS
800.0000 mg | ORAL_TABLET | Freq: Three times a day (TID) | ORAL | Status: DC
  Filled 2019-06-25: qty 1

## 2019-06-25 MED ORDER — PROMETHAZINE HCL 25 MG/ML IJ SOLN: 6.2500 mg | INTRAMUSCULAR | Status: DC | PRN

## 2019-06-25 MED ORDER — LIDOCAINE 2% (20 MG/ML) 5 ML SYRINGE
INTRAMUSCULAR | Status: AC
  Filled 2019-06-25: qty 5

## 2019-06-25 MED ORDER — DIPHENHYDRAMINE HCL 25 MG PO CAPS: 25.0000 mg | ORAL_CAPSULE | ORAL | Status: DC | PRN

## 2019-06-25 MED ORDER — DIPHENHYDRAMINE HCL 50 MG/ML IJ SOLN: 12.5000 mg | INTRAMUSCULAR | Status: DC | PRN

## 2019-06-25 MED ORDER — ONDANSETRON HCL 4 MG/2ML IJ SOLN
INTRAMUSCULAR | Status: AC
  Filled 2019-06-25: qty 2

## 2019-06-25 MED ORDER — SODIUM CHLORIDE 0.9% FLUSH: 3.0000 mL | INTRAVENOUS | Status: DC | PRN

## 2019-06-25 MED ORDER — KETOROLAC TROMETHAMINE 30 MG/ML IJ SOLN
30.0000 mg | Freq: Four times a day (QID) | INTRAMUSCULAR | Status: AC | PRN
  Filled 2019-06-25: qty 1

## 2019-06-25 MED ORDER — OXYTOCIN 40 UNITS IN NORMAL SALINE INFUSION - SIMPLE MED
INTRAVENOUS | Status: AC
  Filled 2019-06-25: qty 1000

## 2019-06-25 MED ORDER — SIMETHICONE 80 MG PO CHEW
80.0000 mg | CHEWABLE_TABLET | Freq: Three times a day (TID) | ORAL | Status: DC
  Administered 2019-06-25 – 2019-06-27 (×6): 80 mg via ORAL
  Filled 2019-06-25 (×6): qty 1

## 2019-06-25 MED ORDER — CEFAZOLIN SODIUM-DEXTROSE 2-4 GM/100ML-% IV SOLN: 2.0000 g | Freq: Once | INTRAVENOUS | Status: DC

## 2019-06-25 MED ORDER — SODIUM CHLORIDE 0.9 % IR SOLN: Status: DC | PRN

## 2019-06-25 MED ORDER — SIMETHICONE 80 MG PO CHEW: 80.0000 mg | CHEWABLE_TABLET | ORAL | Status: DC | PRN

## 2019-06-25 MED ORDER — DIPHENHYDRAMINE HCL 25 MG PO CAPS: 25.0000 mg | ORAL_CAPSULE | Freq: Four times a day (QID) | ORAL | Status: DC | PRN

## 2019-06-25 MED ORDER — SODIUM BICARBONATE 8.4 % IV SOLN
INTRAVENOUS | Status: DC | PRN
  Administered 2019-06-25 (×2): 5 mL via EPIDURAL

## 2019-06-25 SURGICAL SUPPLY — 40 items
BENZOIN TINCTURE PRP APPL 2/3 (GAUZE/BANDAGES/DRESSINGS) ×2 IMPLANT
CHLORAPREP W/TINT 26ML (MISCELLANEOUS) ×3 IMPLANT
CLAMP CORD UMBIL (MISCELLANEOUS) IMPLANT
CLOSURE STERI STRIP 1/2 X4 (GAUZE/BANDAGES/DRESSINGS) ×2 IMPLANT
CLOSURE WOUND 1/2 X4 (GAUZE/BANDAGES/DRESSINGS)
CLOTH BEACON ORANGE TIMEOUT ST (SAFETY) ×3 IMPLANT
DRAPE C SECTION CLR SCREEN (DRAPES) ×3 IMPLANT
DRSG OPSITE POSTOP 4X10 (GAUZE/BANDAGES/DRESSINGS) ×3 IMPLANT
ELECT REM PT RETURN 9FT ADLT (ELECTROSURGICAL) ×3
ELECTRODE REM PT RTRN 9FT ADLT (ELECTROSURGICAL) ×1 IMPLANT
EXTRACTOR VACUUM KIWI (MISCELLANEOUS) IMPLANT
GLOVE BIO SURGEON STRL SZ 6.5 (GLOVE) ×2 IMPLANT
GLOVE BIO SURGEONS STRL SZ 6.5 (GLOVE) ×1
GLOVE BIOGEL PI IND STRL 7.0 (GLOVE) ×2 IMPLANT
GLOVE BIOGEL PI INDICATOR 7.0 (GLOVE) ×4
GOWN STRL REUS W/TWL LRG LVL3 (GOWN DISPOSABLE) ×6 IMPLANT
HOVERMATT SINGLE USE (MISCELLANEOUS) ×2 IMPLANT
KIT ABG SYR 3ML LUER SLIP (SYRINGE) IMPLANT
NDL HYPO 25X5/8 SAFETYGLIDE (NEEDLE) IMPLANT
NEEDLE HYPO 25X5/8 SAFETYGLIDE (NEEDLE) IMPLANT
NS IRRIG 1000ML POUR BTL (IV SOLUTION) ×3 IMPLANT
PACK C SECTION WH (CUSTOM PROCEDURE TRAY) ×3 IMPLANT
PAD OB MATERNITY 4.3X12.25 (PERSONAL CARE ITEMS) ×3 IMPLANT
RETRACTOR WND ALEXIS 25 LRG (MISCELLANEOUS) ×1 IMPLANT
RTRCTR C-SECT PINK 25CM LRG (MISCELLANEOUS) IMPLANT
RTRCTR WOUND ALEXIS 25CM LRG (MISCELLANEOUS) ×3
STRIP CLOSURE SKIN 1/2X4 (GAUZE/BANDAGES/DRESSINGS) IMPLANT
SUT CHROMIC 1 CTX 36 (SUTURE) ×6 IMPLANT
SUT PLAIN 0 NONE (SUTURE) IMPLANT
SUT PLAIN 2 0 XLH (SUTURE) ×3 IMPLANT
SUT VIC AB 0 CT1 27 (SUTURE) ×4
SUT VIC AB 0 CT1 27XBRD ANBCTR (SUTURE) ×2 IMPLANT
SUT VIC AB 2-0 CT1 27 (SUTURE) ×2
SUT VIC AB 2-0 CT1 TAPERPNT 27 (SUTURE) ×1 IMPLANT
SUT VIC AB 3-0 CT1 27 (SUTURE)
SUT VIC AB 3-0 CT1 TAPERPNT 27 (SUTURE) IMPLANT
SUT VIC AB 4-0 KS 27 (SUTURE) ×3 IMPLANT
TOWEL OR 17X24 6PK STRL BLUE (TOWEL DISPOSABLE) ×3 IMPLANT
TRAY FOLEY W/BAG SLVR 14FR LF (SET/KITS/TRAYS/PACK) ×3 IMPLANT
WATER STERILE IRR 1000ML POUR (IV SOLUTION) ×3 IMPLANT

## 2019-06-25 NOTE — Transfer of Care (Signed)
Immediate Anesthesia Transfer of Care Note  Patient: Dana Smith  Procedure(s) Performed: CESAREAN SECTION (N/A Abdomen)  Patient Location: PACU  Anesthesia Type:Epidural  Level of Consciousness: awake  Airway & Oxygen Therapy: Patient Spontanous Breathing  Post-op Assessment: Report given to RN  Post vital signs: Reviewed and stable  Last Vitals:  Vitals Value Taken Time  BP 117/80 06/25/19 0700  Temp    Pulse 72 06/25/19 0703  Resp 22 06/25/19 0703  SpO2 94 % 06/25/19 0703  Vitals shown include unvalidated device data.  Last Pain:  Vitals:   06/25/19 0354  TempSrc: Oral  PainSc: 4          Complications: No apparent anesthesia complications

## 2019-06-25 NOTE — Op Note (Signed)
Operative Note    Preoperative Diagnosis: IUP at 83 1/7wks                                             GDMA2                                             Arrest of descent and dilation   Postoperative Diagnosis: Same   Procedure: Primary low transverse cesarean section with double layered closure; mild extension on left   Surgeon: Mickle Mallory DO Scrub: Lamount Cohen  Anesthesia: Epidural  Fluids: LR 2321ml EBL: 31mml UOP: 150ml   Findings: Viable female infant in vertex position, loose nuchal x 1, Grossly normal uterus, tubes and ovaries Apgars 7,9, Weight pending   Specimen: Placenta to L/D   Procedure Note Patient was taken to the operating room where epidural anesthesia was updosed and found to be adequate. She was placed in the dorsal supine position with a leftward tilt. A foley was placed  in a sterile manner to drain the bladder.  Pt was prepped and draped in the usual sterile fashion. An appropriate time out was performed. Allis clamp test confirmed adequate anesthesia. A Pfannenstiel skin incision was then made with the scalpel and carried through to the underlying layer of fascia by sharp dissection and Bovie cautery. The fascia was nicked in the midline and the incision was extended laterally with Mayo scissors. The superior, then inferior, aspects of the incision were grasped with Kocher clamps and dissected off the underlying rectus muscles. Rectus muscles were separated in the midline and the peritoneal cavity entered bluntly. The peritoneal incision was then extended both superiorly and inferiorly with careful attention to avoid both bowel and bladder. The Alexis self-retaining wound retractor was then placed within the incision and the lower uterine segment exposed. The bladder flap was developed with Metzenbaum scissors and pushed away from the lower uterine segment. The lower uterine segment was then incised in a transverse fashion and the cavity itself entered bluntly. The  incision was extended bluntly. The vertex was then easily  lifted and delivered from the incision without difficulty. A loose nuchal was reduced over infants head.  The remainder of the infant delivered easily. Bulb suction of mouth and nose was performed.  After a minute delay, the cord was clamped and cut. Cord gas and blood obtained.  The infant was handed off to the waiting NICU team. The placenta was then spontaneously expressed from the uterus and the uterus cleared of all clots and debris with moist lap sponge. The uterine incision was inspected. A small extension in the left corner was identified and repaired with great hemostasis. The uterine incision was then repaired in 2 layers:  the first layer was a running locked layer 0 chromic and the second an imbricating layer of the same suture. The tubes and ovaries were inspected and the gutters cleared of all clots and debris. The uterine incision was inspected again and found to be hemostatic. All instruments and sponges as well as the Alexis retractor were then removed from the abdomen. The  peritoneum was then sutured in a pursestring fashion; the rectus muscles were reapproximated with a large figure 8 stitch using 2-0 vicryl. . The fascia was then  closed with 0 Vicryl in a running fashion. Subcutaneous tissue was reapproximated with 3-0 plain in a running fashion. The skin was closed with a subcuticular stitch of 4-0 Vicryl on a Keith needle and then reinforced with benzoin and Steri-Strips. At the conclusion of the procedure all instruments and sponge counts were correct. Patient was taken to the recovery room in good condition with her baby accompanying her skin to skin.

## 2019-06-25 NOTE — Anesthesia Postprocedure Evaluation (Signed)
Anesthesia Post Note  Patient: Dana Smith  Procedure(s) Performed: CESAREAN SECTION (N/A Abdomen)     Patient location during evaluation: PACU Anesthesia Type: Epidural Level of consciousness: awake Pain management: pain level controlled Vital Signs Assessment: post-procedure vital signs reviewed and stable Respiratory status: spontaneous breathing Cardiovascular status: stable Postop Assessment: no headache, no backache, epidural receding, patient able to bend at knees and no apparent nausea or vomiting Anesthetic complications: no    Last Vitals:  Vitals:   06/25/19 0932 06/25/19 1033  BP: 116/79 116/65  Pulse: 72 68  Resp: 17 18  Temp: 36.9 C   SpO2: 95% 96%    Last Pain:  Vitals:   06/25/19 1030  TempSrc:   PainSc: 2    Pain Goal:                   Huston Foley

## 2019-06-25 NOTE — Progress Notes (Signed)
Patient ID: Dana Smith, female   DOB: 07-06-1985, 34 y.o.   MRN: [id number] Cervical welling reduced however cervix unchanged at 9cm and 0 station Adequate MVUs x > 4hrs  Explained to patient concern for arrest of descent.   I recommended delivery via cesarean section.  Reviewed risks and benefits of c/s delivery including risk of infection, bleeding and and organ injury  Consent obtained  Staff and Anesthesiologist notified  Stop pitocin  ANcef preop  SCds to OR  To OR when ready

## 2019-06-25 NOTE — Lactation Note (Signed)
This note was copied from a baby's chart. Lactation Consultation Note  Patient Name: Dana Smith [id number] Date: 06/25/2019 Reason for consult: Initial assessment;Early term 37-38.6wks;Primapara;1st time breastfeeding   Baby is 75 hours old of a P1 mother. Baby is sleeping in basinet upon arrival and parents stated baby was breastfeeding for 5 min but fell asleep on breast. Mother states she wants to rest for a little bit before trying again. LC encouraged parents to have baby skin to skin and/or unwrap baby and change diaper to wake her up.   Reviewed with mother average size of a NB stomach. Encourage to follow babies' hunger and fullness cues. Mother shared questions about pacifier use and LC explained babies feeding cues and how pacifiers can mask cues. Also, explained pacifiers can be safely used once breastfeeding has been well established. Reviewed importance to offer the breast 8 to 12 times in a 24-hour period for proper stimulation and to establish good milk supply.     Mother asked about how early can she start pumping. LC explained the benefits from feeding baby directly from breast especially while she has colostrum. Reviewed colostrum benefits for baby. Also LC mentioned pumping can be done as soon as mother prefers.   Reviewed breastfeeding basics. Discussed milk coming to volume. Reviewed ETI behavior and expectations with mother and encouraged to contact [facility] for support when ready to breastfeed baby and recommended to request help for questions or concerns.    All questions answered at this time.   Maternal Data Does the patient have breastfeeding experience prior to this delivery?: No   Consult Status Consult Status: Follow-up Date: 06/26/19 Follow-up type: In-patient    Jaymarie Yeakel A Higuera Ancidey 06/25/2019, 3:35 PM

## 2019-06-25 NOTE — Progress Notes (Signed)
Patient ID: Dana Smith, female   DOB: 1985-11-21, 34 y.o.   MRN: [id number] Pt reports pelvic pressure. Per nurse, pt had a panic attack a few hours ago. Was able to talk her through it. Anxious about delivery VSS GEN - NAD EFM - cat a, 140s, occ variables and early decels TOCO - ctxs q 2 mins  MVUs - >220; Pitocin at 50mus  A/P: Progressing well in labor         Will give benadryl to help swelling         Recheck in 1-2 hrs or prn

## 2019-06-26 ENCOUNTER — Encounter: Payer: Self-pay | Admitting: *Deleted

## 2019-06-26 LAB — CBC
HCT: 25.9 % — ABNORMAL LOW (ref 36.0–46.0)
Hemoglobin: 8.4 g/dL — ABNORMAL LOW (ref 12.0–15.0)
MCH: 29.1 pg (ref 26.0–34.0)
MCHC: 32.4 g/dL (ref 30.0–36.0)
MCV: 89.6 fL (ref 80.0–100.0)
Platelets: 179 10*3/uL (ref 150–400)
RBC: 2.89 MIL/uL — ABNORMAL LOW (ref 3.87–5.11)
RDW: 14.3 % (ref 11.5–15.5)
WBC: 10 10*3/uL (ref 4.0–10.5)
nRBC: 0 % (ref 0.0–0.2)

## 2019-06-26 LAB — BIRTH TISSUE RECOVERY COLLECTION (PLACENTA DONATION)

## 2019-06-26 MED ORDER — ACETAMINOPHEN 500 MG PO TABS
1000.0000 mg | ORAL_TABLET | ORAL | Status: DC | PRN
  Administered 2019-06-26 – 2019-06-27 (×4): 1000 mg via ORAL
  Filled 2019-06-26 (×4): qty 2

## 2019-06-26 NOTE — Progress Notes (Signed)
Subjective: Postpartum Day 1: Cesarean Delivery Patient reports tolerating PO, + flatus and no problems voiding.    Objective: Vital signs in last 24 hours: Temp:  [97.7 F (36.5 C)-98.4 F (36.9 C)] 98 F (36.7 C) (05/04 0630) Pulse Rate:  [64-75] 73 (05/04 0630) Resp:  [17-19] 18 (05/04 0630) BP: (93-116)/(50-79) 93/56 (05/04 0630) SpO2:  [95 %-100 %] 96 % (05/04 0630)  Physical Exam:  General: alert and cooperative Lochia: appropriate Uterine Fundus: firm Incision: C/D/I   Recent Labs    06/24/19 0020 06/26/19 0556  HGB 10.4* 8.4*  HCT 31.4* 25.9*    Assessment/Plan: Status post Cesarean section. Doing well postoperatively.  Continue current care. Requests tylenol instead of ibuprofen  Logan Bores 06/26/2019, 9:15 AM

## 2019-06-27 MED ORDER — IBUPROFEN 800 MG PO TABS: 800.0000 mg | ORAL_TABLET | Freq: Three times a day (TID) | ORAL | 1 refills | Status: DC | PRN

## 2019-06-27 MED ORDER — OXYCODONE HCL 5 MG PO TABS: 5.0000 mg | ORAL_TABLET | Freq: Four times a day (QID) | ORAL | 0 refills | Status: DC | PRN

## 2019-06-27 NOTE — Progress Notes (Signed)
Subjective: Postpartum Day 2: Cesarean Delivery Patient reports incisional pain, tolerating PO and no problems voiding.    Objective: Vital signs in last 24 hours: Temp:  [97.8 F (36.6 C)-98.9 F (37.2 C)] 98.9 F (37.2 C) (05/05 0525) Pulse Rate:  [65-87] 65 (05/05 0525) Resp:  [17-18] 18 (05/05 0525) BP: (110-120)/(70-75) 119/75 (05/05 0525) SpO2:  [100 %] 100 % (05/05 0525)  Physical Exam:  General: alert and no distress Lochia: appropriate Uterine Fundus: firm Incision: healing well DVT Evaluation: No evidence of DVT seen on physical exam.  Recent Labs    06/26/19 0556  HGB 8.4*  HCT 25.9*    Assessment/Plan: Status post Cesarean section. Doing well postoperatively.  Pt desires d/c to home - if peds OK w d/c will send w motrin, ocycodone and PNV.  F/u 2 and 6 weeks  Dana Smith 06/27/2019, 8:14 AM

## 2019-06-27 NOTE — Discharge Summary (Signed)
OB Discharge Summary     Patient Name: Dana Smith DOB: [birth year] MRN: [id number]  Date of admission: 06/24/2019 Delivering MD: Carlynn Purl [facility]   Date of discharge: 06/27/2019  Admitting diagnosis: GDM, class A2 [O24.419] Intrauterine pregnancy: [redacted]w[redacted]d     Secondary diagnosis:  Active Problems:   GDM, class A2   Arrest of dilation, delivered, current hospitalization  Additional problems: relative blood loss anemia, tolerating well     Discharge diagnosis: Term Pregnancy Delivered                                                                                                Post partum procedures:N/A  Augmentation: Pitocin, Cytotec and Foley Balloon  Complications: None  Hospital course:  Induction of Labor With Cesarean Section  34 y.o. yo G2P1011 at [redacted]w[redacted]d was admitted to the hospital 06/24/2019 for induction of labor. Patient had a labor course significant for A2DM. The patient went for cesarean section due to Arrest of Dilation, and delivered a Viable infant,06/25/2019  Membrane Rupture Time/Date: 1:32 PM ,06/24/2019   Details of operation can be found in separate operative Note.  Patient had an uncomplicated postpartum course. She is ambulating, tolerating a regular diet, passing flatus, and urinating well.  Patient is discharged home in stable condition on 06/27/19.                                    Physical exam  Vitals:   06/26/19 0630 06/26/19 1510 06/26/19 2101 06/27/19 0525  BP: (!) 93/56 110/71 120/70 119/75  Pulse: 73 87 81 65  Resp: 18 18 17 18   Temp: 98 F (36.7 C) 97.8 F (36.6 C) 98.6 F (37 C) 98.9 F (37.2 C)  TempSrc: Oral Oral Oral Oral  SpO2: 96% 100% 100% 100%  Weight:      Height:       General: alert and no distress Lochia: appropriate Uterine Fundus: firm Incision: Healing well with no significant drainage DVT Evaluation: No evidence of DVT seen on physical exam. Labs: Lab Results  Component Value Date   WBC 10.0 06/26/2019    HGB 8.4 (L) 06/26/2019   HCT 25.9 (L) 06/26/2019   MCV 89.6 06/26/2019   PLT 179 06/26/2019   CMP Latest Ref Rng & Units 06/24/2019  Glucose 70 - 99 mg/dL 115(H)  BUN 6 - 20 mg/dL 9  Creatinine 0.44 - 1.00 mg/dL 0.70  Sodium 135 - 145 mmol/L 136  Potassium 3.5 - 5.1 mmol/L 3.5  Chloride 98 - 111 mmol/L 109  CO2 22 - 32 mmol/L 19(L)  Calcium 8.9 - 10.3 mg/dL 8.8(L)  Total Protein 6.5 - 8.1 g/dL 5.7(L)  Total Bilirubin 0.3 - 1.2 mg/dL 0.3  Alkaline Phos 38 - 126 U/L 154(H)  AST 15 - 41 U/L 20  ALT 0 - 44 U/L 20    Discharge instruction: per After Visit Summary and "Baby and Me Booklet".  After visit meds:  Allergies as of 06/27/2019   No Known Allergies  Medication List    STOP taking these medications   metFORMIN 500 MG tablet Commonly known as: GLUCOPHAGE     TAKE these medications   acetaminophen 500 MG tablet Commonly known as: TYLENOL Take 1,000 mg by mouth every 6 (six) hours as needed for mild pain or headache.   ibuprofen 800 MG tablet Commonly known as: ADVIL Take 1 tablet (800 mg total) by mouth every 8 (eight) hours as needed. What changed:   medication strength  how much to take  when to take this  reasons to take this   oxyCODONE 5 MG immediate release tablet Commonly known as: Oxy IR/ROXICODONE Take 1-2 tablets (5-10 mg total) by mouth every 6 (six) hours as needed for moderate pain.       Diet: routine diet  Activity: Advance as tolerated. Pelvic rest for 6 weeks.   Outpatient follow up:2 and 6 weeks Follow up Appt: Future Appointments  Date Time Provider [place]  07/02/2019 10:00 AM [facility]   Follow up Visit:No follow-ups on file.  Postpartum contraception: Undecided  Newborn Data: Live born female  Birth Weight: 6 lb 9.5 oz (2990 g) APGAR: 7, 9  Newborn Delivery   Birth date/time: 06/25/2019 06:16:00 Delivery type: C-Section, Low Transverse Trial of labor: No C-section  categorization: Primary      Baby Feeding: Bottle and Breast Disposition:home with mother   06/27/2019 Janyth Contes, MD

## 2019-06-27 NOTE — Lactation Note (Signed)
This note was copied from a baby's chart. Lactation Consultation Note  Patient Name: Girl Quan Sierra [id number] Date: 06/27/2019 Reason for consult: Follow-up assessment;Primapara;1st time breastfeeding;Early term 37-38.6wks;Infant weight loss;Other (Comment)(8 % weight loss - milk coming in - F/U appt tomorrow with Ped[facility] per mom)  Baby is 43 hours old , P 1 , ' As LC entered the room mom working on latching on the left breast in cradle and having  A difficult time. LC offered to assist with cross cradle and was able to obtain the depth.  LC burped the baby and switched to the football position on the left breast and baby easily latched with depth, fed for 15 mins with increased swallows.  Per mom comfortable. Baby became non - nutritive and LC instructed mom on how to release suction , nipple slightly slanted.  LC reviewed hand expressing and had mom apply her EBM to her nipples.  [place] reminded mom and instructed on the need for the breast shells due to some areola edema between feedings except when sleeping.  Sore nipple and engorgement prevention and tx reviewed.  Per mom was given a hand pump and has a DEBP at home.  Discussed with parents nutritive vs non -nutritive feeding patterns and the importance of  Watching for hanging out latched.  Due to 8 % weight loss recommended to mom when her milk comes in if the baby only feeds 1st breast , release the 2nd with hand expressing or pumping with hand pump and spoon feed  Back to baby. Also can add post pumping both breast with DEBP after several feedings to enhance volume .  Baby is jaundice and LC recommended to feed near a window of place the baby's basin near the window for the sun tx.  LC discussed how being jaundice and 8 % weight loss can potentially make a baby sleepy.  Important to feed with feeding cues and by 3 hours around the clock.  LC reviewed [place] resources after D/C and provided the [facility] pamphlet with phone numbers.  LC discussed the  virtual support groups .     Maternal Data Has patient been taught Hand Expression?: Yes  Feeding Feeding Type: Breast Fed  LATCH Score Latch: Grasps breast easily, tongue down, lips flanged, rhythmical sucking.  Audible Swallowing: Spontaneous and intermittent  Type of Nipple: Everted at rest and after stimulation  Comfort (Breast/Nipple): Filling, red/small blisters or bruises, mild/mod discomfort  Hold (Positioning): Assistance needed to correctly position infant at breast and maintain latch.  LATCH Score: 8  Interventions Interventions: Breast feeding basics reviewed;Assisted with latch;Skin to skin;Breast massage;Hand express;Reverse pressure;Breast compression;Adjust position;Support pillows;Position options;Expressed milk;Shells;Hand pump  Lactation Tools Discussed/Used Tools: Shells;Pump;Flanges Flange Size: 24;27 Shell Type: Inverted Breast pump type: Manual WIC Program: No Pump Review: Milk Storage   Consult Status Consult Status: Complete Date: 06/27/19    Myer Haff 06/27/2019, 10:50 AM

## 2019-06-27 NOTE — Lactation Note (Addendum)
This note was copied from a baby's chart. Lactation Consultation Note  Patient Name: Dana Smith [id number] Date: 06/27/2019 Reason for consult: Follow-up assessment P1, 42 hour ETI infant -5% weight loss, " poor latches" Mom with flat nipples given hand pump and breast shells. Mom was attempting to latch infant on right breast using t cradle hold, but infant not interested in latching and not sustaining latch at this time. Per mom, infant latched well previously at 9 pm and breastfed for 30 minutes RN observed latched and infant had swallows with " cuh" sound. Infant reluctant to latch at this time, mom did hand expression and infant took 25 mls of EBM by foley cup, mom then did STS with infant. Mom wants to use DEBP, she asked earlier but was never set up with pump. Per mom ,she has the same type of pump at home and wants to know how to use it, mom will pump 4 to 6 times within 24 hours. Mom will continue to work on latching infant at breast and continue to breastfeed according to hunger cues, on demand, 8 to 12+ times within 24 hours and not exceed 3 hours without feeding infant. Mom knows to call RN or LC if she needs further assistance with latching infant at breast.   Maternal Data    Feeding Feeding Type: Breast Fed  LATCH Score             Interventions Interventions: Assisted with latch;Adjust position;Support pillows;Position options  Lactation Tools Discussed/Used     Consult Status      Vicente Serene 06/27/2019, 12:15 AM

## 2019-07-01 ENCOUNTER — Encounter (HOSPITAL_COMMUNITY): Payer: Self-pay | Admitting: Obstetrics and Gynecology

## 2019-07-01 ENCOUNTER — Inpatient Hospital Stay (HOSPITAL_COMMUNITY)
Admission: AD | Admit: 2019-07-01 | Discharge: 2019-07-03 | DRG: 776 | Disposition: A | Discharge status: Home / Self Care | Payer: No Typology Code available for payment source | Attending: Obstetrics and Gynecology | Admitting: Obstetrics and Gynecology

## 2019-07-01 ENCOUNTER — Ambulatory Visit (HOSPITAL_COMMUNITY)
Admission: EM | Admit: 2019-07-01 | Discharge: 2019-07-01 | Disposition: A | Discharge status: Home / Self Care | Payer: No Typology Code available for payment source | Source: Home / Self Care

## 2019-07-01 ENCOUNTER — Inpatient Hospital Stay (HOSPITAL_COMMUNITY): Payer: No Typology Code available for payment source

## 2019-07-01 ENCOUNTER — Other Ambulatory Visit: Payer: Self-pay

## 2019-07-01 DIAGNOSIS — O99893 Other specified diseases and conditions complicating puerperium: Secondary | ICD-10-CM | POA: Diagnosis present

## 2019-07-01 DIAGNOSIS — R51 Headache with orthostatic component, not elsewhere classified: Secondary | ICD-10-CM

## 2019-07-01 DIAGNOSIS — R0602 Shortness of breath: Secondary | ICD-10-CM

## 2019-07-01 DIAGNOSIS — R0682 Tachypnea, not elsewhere classified: Secondary | ICD-10-CM | POA: Diagnosis present

## 2019-07-01 DIAGNOSIS — J811 Chronic pulmonary edema: Secondary | ICD-10-CM | POA: Diagnosis present

## 2019-07-01 DIAGNOSIS — Z20822 Contact with and (suspected) exposure to covid-19: Secondary | ICD-10-CM | POA: Diagnosis present

## 2019-07-01 DIAGNOSIS — O1495 Unspecified pre-eclampsia, complicating the puerperium: Principal | ICD-10-CM | POA: Diagnosis present

## 2019-07-01 DIAGNOSIS — O165 Unspecified maternal hypertension, complicating the puerperium: Secondary | ICD-10-CM

## 2019-07-01 DIAGNOSIS — R519 Headache, unspecified: Secondary | ICD-10-CM | POA: Diagnosis not present

## 2019-07-01 LAB — URINALYSIS, ROUTINE W REFLEX MICROSCOPIC
Bilirubin Urine: NEGATIVE
Glucose, UA: NEGATIVE mg/dL
Ketones, ur: NEGATIVE mg/dL
Nitrite: NEGATIVE
Protein, ur: NEGATIVE mg/dL
Specific Gravity, Urine: 1.012 (ref 1.005–1.030)
WBC, UA: >50 WBC/hpf — ABNORMAL HIGH (ref 0–5)
pH: 5 (ref 5.0–8.0)

## 2019-07-01 LAB — CBC
HCT: 32.7 % — ABNORMAL LOW (ref 36.0–46.0)
HCT: 33.4 % — ABNORMAL LOW (ref 36.0–46.0)
Hemoglobin: 10.1 g/dL — ABNORMAL LOW (ref 12.0–15.0)
Hemoglobin: 10.7 g/dL — ABNORMAL LOW (ref 12.0–15.0)
MCH: 27.7 pg (ref 26.0–34.0)
MCH: 28.4 pg (ref 26.0–34.0)
MCHC: 30.9 g/dL (ref 30.0–36.0)
MCHC: 32 g/dL (ref 30.0–36.0)
MCV: 89.6 fL (ref 80.0–100.0)
MCV: 88.6 fL (ref 80.0–100.0)
Platelets: 447 10*3/uL — ABNORMAL HIGH (ref 150–400)
Platelets: 442 10*3/uL — ABNORMAL HIGH (ref 150–400)
RBC: 3.65 MIL/uL — ABNORMAL LOW (ref 3.87–5.11)
RBC: 3.77 MIL/uL — ABNORMAL LOW (ref 3.87–5.11)
RDW: 13.9 % (ref 11.5–15.5)
RDW: 14 % (ref 11.5–15.5)
WBC: 13.1 10*3/uL — ABNORMAL HIGH (ref 4.0–10.5)
WBC: 16.1 10*3/uL — ABNORMAL HIGH (ref 4.0–10.5)
nRBC: 0 % (ref 0.0–0.2)
nRBC: 0.1 % (ref 0.0–0.2)

## 2019-07-01 LAB — COMPREHENSIVE METABOLIC PANEL
ALT: 68 U/L — ABNORMAL HIGH (ref 0–44)
AST: 52 U/L — ABNORMAL HIGH (ref 15–41)
Albumin: 2.5 g/dL — ABNORMAL LOW (ref 3.5–5.0)
Alkaline Phosphatase: 223 U/L — ABNORMAL HIGH (ref 38–126)
Anion gap: 11 (ref 5–15)
BUN: 11 mg/dL (ref 6–20)
CO2: 21 mmol/L — ABNORMAL LOW (ref 22–32)
Calcium: 8.6 mg/dL — ABNORMAL LOW (ref 8.9–10.3)
Chloride: 109 mmol/L (ref 98–111)
Creatinine, Ser: 0.86 mg/dL (ref 0.44–1.00)
GFR calc Af Amer: >60 mL/min (ref >60)
GFR calc non Af Amer: >60 mL/min (ref >60)
Glucose, Bld: 112 mg/dL — ABNORMAL HIGH (ref 70–99)
Potassium: 4.4 mmol/L (ref 3.5–5.1)
Sodium: 141 mmol/L (ref 135–145)
Total Bilirubin: 0.6 mg/dL (ref 0.3–1.2)
Total Protein: 6.1 g/dL — ABNORMAL LOW (ref 6.5–8.1)

## 2019-07-01 LAB — RESPIRATORY PANEL BY RT PCR (FLU A&B, COVID)
Influenza A by PCR: NEGATIVE
Influenza B by PCR: NEGATIVE
SARS Coronavirus 2 by RT PCR: NEGATIVE

## 2019-07-01 LAB — PROTEIN / CREATININE RATIO, URINE
Creatinine, Urine: 83.41 mg/dL
Protein Creatinine Ratio: 0.11 mg/mg{Cre} (ref 0.00–0.15)
Total Protein, Urine: 9 mg/dL

## 2019-07-01 MED ORDER — MAGNESIUM SULFATE 40 GM/1000ML IV SOLN
2.0000 g/h | INTRAVENOUS | Status: AC
  Administered 2019-07-01 – 2019-07-02 (×2): 2 g/h via INTRAVENOUS
  Filled 2019-07-01 (×2): qty 1000

## 2019-07-01 MED ORDER — DEXAMETHASONE SODIUM PHOSPHATE 10 MG/ML IJ SOLN
10.0000 mg | Freq: Once | INTRAMUSCULAR | Status: AC
  Administered 2019-07-01: 10 mg via INTRAVENOUS
  Filled 2019-07-01: qty 1

## 2019-07-01 MED ORDER — HYDRALAZINE HCL 20 MG/ML IJ SOLN
10.0000 mg | INTRAMUSCULAR | Status: DC | PRN
  Administered 2019-07-01: 10 mg via INTRAVENOUS
  Filled 2019-07-01: qty 1

## 2019-07-01 MED ORDER — FUROSEMIDE 10 MG/ML IJ SOLN
40.0000 mg | Freq: Once | INTRAMUSCULAR | Status: AC
  Administered 2019-07-01: 40 mg via INTRAVENOUS
  Filled 2019-07-01: qty 4

## 2019-07-01 MED ORDER — LABETALOL HCL 5 MG/ML IV SOLN: 40.0000 mg | INTRAVENOUS | Status: DC | PRN

## 2019-07-01 MED ORDER — LABETALOL HCL 5 MG/ML IV SOLN: 80.0000 mg | INTRAVENOUS | Status: DC | PRN

## 2019-07-01 MED ORDER — LABETALOL HCL 5 MG/ML IV SOLN: 20.0000 mg | INTRAVENOUS | Status: DC | PRN

## 2019-07-01 MED ORDER — MAGNESIUM SULFATE BOLUS VIA INFUSION
4.0000 g | Freq: Once | INTRAVENOUS | Status: AC
  Administered 2019-07-01: 4 g via INTRAVENOUS
  Filled 2019-07-01: qty 1000

## 2019-07-01 MED ORDER — LACTATED RINGERS IV BOLUS (SEPSIS)
1000.0000 mL | Freq: Once | INTRAVENOUS | Status: AC
  Administered 2019-07-01: 1000 mL via INTRAVENOUS

## 2019-07-01 MED ORDER — BUTALBITAL-APAP-CAFFEINE 50-325-40 MG PO TABS: 1.0000 | ORAL_TABLET | Freq: Four times a day (QID) | ORAL | Status: AC | PRN

## 2019-07-01 MED ORDER — IOHEXOL 350 MG/ML SOLN
50.0000 mL | Freq: Once | INTRAVENOUS | Status: AC | PRN
  Administered 2019-07-01: 50 mL via INTRAVENOUS

## 2019-07-01 MED ORDER — ENOXAPARIN SODIUM 60 MG/0.6ML ~~LOC~~ SOLN
60.0000 mg | SUBCUTANEOUS | Status: DC
  Administered 2019-07-01 – 2019-07-02 (×2): 60 mg via SUBCUTANEOUS
  Filled 2019-07-01 (×2): qty 0.6

## 2019-07-01 MED ORDER — LACTATED RINGERS IV SOLN: INTRAVENOUS | Status: AC

## 2019-07-01 MED ORDER — DIPHENHYDRAMINE HCL 50 MG/ML IJ SOLN
25.0000 mg | Freq: Once | INTRAMUSCULAR | Status: AC
  Administered 2019-07-01: 25 mg via INTRAVENOUS
  Filled 2019-07-01: qty 1

## 2019-07-01 MED ORDER — METOCLOPRAMIDE HCL 5 MG/ML IJ SOLN
10.0000 mg | Freq: Once | INTRAMUSCULAR | Status: AC
  Administered 2019-07-01: 10 mg via INTRAVENOUS
  Filled 2019-07-01: qty 2

## 2019-07-01 MED ORDER — HYDRALAZINE HCL 20 MG/ML IJ SOLN: 10.0000 mg | INTRAMUSCULAR | Status: DC | PRN

## 2019-07-01 NOTE — MAU Note (Signed)
Patients sats around 87%. Len Blalock CNM bedside. Verbal order for oxygen 2 Liters received.

## 2019-07-01 NOTE — MAU Provider Note (Signed)
Chief Complaint: Headache and Emesis   None     SUBJECTIVE HPI: Dana Smith is a 34 y.o. G2P1011 who is s/p primary cesarean section for failure to progress on 06/25/19 presents to maternity admissions reporting onset of headache with n/v today. She had shortness of breath last night which was worse lying down than sitting up so she did not sleep well. The shortness of breath improved today but she developed a headache this morning that is constant frontal, and throbbing when lying down, improved by sitting up.  After eating a parfait made by her neighbor with flaxseeds, hempseed, and almonds, she felt nauseous and vomited x 4. Her headache worsened with vomiting. She took an oxycodone and drank water but these did not improve the pain.  There are no other symptoms. She has not tried any other treatments.    HPI  Past Medical History:  Diagnosis Date  . Arrest of dilation, delivered, current hospitalization 06/25/2019  . Fibroid   . Gestational diabetes    metformin   Past Surgical History:  Procedure Laterality Date  . CESAREAN SECTION N/A 06/25/2019   Procedure: CESAREAN SECTION;  Surgeon: Sherlyn Hay, DO;  Location: [facility];  Service: Obstetrics;  Laterality: N/A;  . LAPAROSCOPY Left 08/11/2018   Procedure: LAPAROSCOPY DIAGNOSTIC, LEFT SALPINGECTOMY WITH REMOVAL OF ECTOPIC PREGNANCY;  Surgeon: Sherlyn Hay, DO;  Location: [place];  Service: Gynecology;  Laterality: Left;  . WISDOM TOOTH EXTRACTION Bilateral    Social History   Socioeconomic History  . Marital status: Single    Spouse name: Not on file  . Number of children: Not on file  . Years of education: Not on file  . Highest education level: Not on file  Occupational History  . Not on file  Tobacco Use  . Smoking status: Never Smoker  . Smokeless tobacco: Never Used  Substance and Sexual Activity  . Alcohol use: Not Currently    Comment: Socially  . Drug use: Never  . Sexual activity: Yes   Other Topics Concern  . Not on file  Social History Narrative  . Not on file   Social Determinants of Health   Financial Resource Strain:   . Difficulty of Paying Living Expenses:   Food Insecurity:   . Worried About Charity fundraiser in the Last Year:   . Arboriculturist in the Last Year:   Transportation Needs:   . Film/video editor (Medical):   Marland Kitchen Lack of Transportation (Non-Medical):   Physical Activity:   . Days of Exercise per Week:   . Minutes of Exercise per Session:   Stress:   . Feeling of Stress :   Social Connections:   . Frequency of Communication with Friends and Family:   . Frequency of Social Gatherings with Friends and Family:   . Attends Religious Services:   . Active Member of Clubs or Organizations:   . Attends Archivist Meetings:   Marland Kitchen Marital Status:   Intimate Partner Violence:   . Fear of Current or Ex-Partner:   . Emotionally Abused:   Marland Kitchen Physically Abused:   . Sexually Abused:    No current facility-administered medications on file prior to encounter.   Current Outpatient Medications on File Prior to Encounter  Medication Sig Dispense Refill  . acetaminophen (TYLENOL) 500 MG tablet Take 1,000 mg by mouth every 6 (six) hours as needed for mild pain or headache.     . ibuprofen (  ADVIL) 800 MG tablet Take 1 tablet (800 mg total) by mouth every 8 (eight) hours as needed. 45 tablet 1  . oxyCODONE (OXY IR/ROXICODONE) 5 MG immediate release tablet Take 1-2 tablets (5-10 mg total) by mouth every 6 (six) hours as needed for moderate pain. 30 tablet 0   No Known Allergies  ROS:  Review of Systems  Constitutional: Negative for chills, fatigue and fever.  HENT: Negative for congestion and sore throat.   Eyes: Negative for visual disturbance.  Respiratory: Positive for shortness of breath.   Cardiovascular: Negative for chest pain.  Gastrointestinal: Negative for abdominal pain, nausea and vomiting.  Genitourinary: Negative for  difficulty urinating, dysuria, flank pain, pelvic pain, vaginal bleeding, vaginal discharge and vaginal pain.  Neurological: Positive for headaches. Negative for dizziness.  Psychiatric/Behavioral: Negative.      I have reviewed patient's Past Medical Hx, Surgical Hx, Family Hx, Social Hx, medications and allergies.   Physical Exam   Patient Vitals for the past 24 hrs:  BP Temp Temp src Pulse Resp SpO2 Height Weight  07/01/19 1745 134/74 -- -- 66 (!) 30 97 % -- --  07/01/19 1735 (!) 145/70 -- -- 75 (!) 30 97 % -- --  07/01/19 1725 (!) 141/71 98.6 F (37 C) Oral 70 (!) 34 99 % -- --  07/01/19 1647 (!) 144/91 -- -- 68 -- -- -- --  07/01/19 1639 (!) 162/78 -- -- -- -- -- -- --  07/01/19 1621 -- -- -- -- -- 91 % -- --  07/01/19 1619 -- -- -- 66 (!) 34 (!) 87 % -- --  07/01/19 1615 (!) 150/79 -- -- 67 -- -- -- --  07/01/19 1602 (!) 148/80 -- -- 67 -- -- -- --  07/01/19 1546 (!) 150/127 -- -- 72 -- -- -- --  07/01/19 1532 (!) 158/96 -- -- 70 -- -- -- --  07/01/19 1502 134/82 -- -- (!) 59 -- -- -- --  07/01/19 1500 133/86 -- -- 61 -- -- -- --  07/01/19 1446 (!) 146/88 98.6 F (37 C) Oral 61 (!) 36 99 % -- --  07/01/19 1442 -- -- -- -- -- -- 5\' 6"  (1.676 m) 129.3 kg   Constitutional: Well-developed, well-nourished female in mild distress.  HEART: normal rate, heart sounds, regular rhythm RESP: tachypnea, labored breathing, lung sounds clear and equal bilaterally, initially but with worsening shortness of breath, crackles in right lower lobe GI: Abd soft, non-tender. Pos BS x 4 MS: Extremities nontender, no edema, normal ROM Neurological - alert, oriented, normal speech, no focal findings or movement disorder noted, screening mental status exam normal, cranial nerves II through XII intact, DTR's normal and symmetric, motor and sensory grossly normal bilaterally, normal muscle tone, no tremors, strength 5/5  GU: Neg CVAT.    LAB RESULTS Results for orders placed or performed during  the hospital encounter of 07/01/19 (from the past 24 hour(s))  Urinalysis, Routine w reflex microscopic     Status: Abnormal   Collection Time: 07/01/19  3:24 PM  Result Value Ref Range   Color, Urine YELLOW YELLOW   APPearance HAZY (A) CLEAR   Specific Gravity, Urine 1.012 1.005 - 1.030   pH 5.0 5.0 - 8.0   Glucose, UA NEGATIVE NEGATIVE mg/dL   Hgb urine dipstick LARGE (A) NEGATIVE   Bilirubin Urine NEGATIVE NEGATIVE   Ketones, ur NEGATIVE NEGATIVE mg/dL   Protein, ur NEGATIVE NEGATIVE mg/dL   Nitrite NEGATIVE NEGATIVE   Leukocytes,Ua LARGE (A) NEGATIVE  RBC / HPF 11-20 0 - 5 RBC/hpf   WBC, UA >50 (H) 0 - 5 WBC/hpf   Bacteria, UA RARE (A) NONE SEEN   Squamous Epithelial / LPF 0-5 0 - 5   Mucus PRESENT   CBC     Status: Abnormal   Collection Time: 07/01/19  3:24 PM  Result Value Ref Range   WBC 13.1 (H) 4.0 - 10.5 K/uL   RBC 3.65 (L) 3.87 - 5.11 MIL/uL   Hemoglobin 10.1 (L) 12.0 - 15.0 g/dL   HCT 32.7 (L) 36.0 - 46.0 %   MCV 89.6 80.0 - 100.0 fL   MCH 27.7 26.0 - 34.0 pg   MCHC 30.9 30.0 - 36.0 g/dL   RDW 13.9 11.5 - 15.5 %   Platelets 447 (H) 150 - 400 K/uL   nRBC 0.0 0.0 - 0.2 %  Comprehensive metabolic panel     Status: Abnormal   Collection Time: 07/01/19  3:24 PM  Result Value Ref Range   Sodium 141 135 - 145 mmol/L   Potassium 4.4 3.5 - 5.1 mmol/L   Chloride 109 98 - 111 mmol/L   CO2 21 (L) 22 - 32 mmol/L   Glucose, Bld 112 (H) 70 - 99 mg/dL   BUN 11 6 - 20 mg/dL   Creatinine, Ser 0.86 0.44 - 1.00 mg/dL   Calcium 8.6 (L) 8.9 - 10.3 mg/dL   Total Protein 6.1 (L) 6.5 - 8.1 g/dL   Albumin 2.5 (L) 3.5 - 5.0 g/dL   AST 52 (H) 15 - 41 U/L   ALT 68 (H) 0 - 44 U/L   Alkaline Phosphatase 223 (H) 38 - 126 U/L   Total Bilirubin 0.6 0.3 - 1.2 mg/dL   GFR calc non Af Amer >60 >60 mL/min   GFR calc Af Amer >60 >60 mL/min   Anion gap 11 5 - 15  Protein / creatinine ratio, urine     Status: None   Collection Time: 07/01/19  3:24 PM  Result Value Ref Range   Creatinine,  Urine 83.41 mg/dL   Total Protein, Urine 9 mg/dL   Protein Creatinine Ratio 0.11 0.00 - 0.15 mg/mg[Cre]  Respiratory Panel by RT PCR (Flu A&B, Covid) - Nasopharyngeal Swab     Status: None   Collection Time: 07/01/19  4:21 PM   Specimen: Nasopharyngeal Swab  Result Value Ref Range   SARS Coronavirus 2 by RT PCR NEGATIVE NEGATIVE   Influenza A by PCR NEGATIVE NEGATIVE   Influenza B by PCR NEGATIVE NEGATIVE    --/--/O POS (05/02 0020)  IMAGING CT Angio Chest PE W and/or Wo Contrast  Result Date: 07/01/2019 CLINICAL DATA:  PE suspected. Give and shortness of breath. The patient is 6 days post quadrant following Caesarean section. EXAM: CT ANGIOGRAPHY CHEST WITH CONTRAST TECHNIQUE: Multidetector CT imaging of the chest was performed using the standard protocol during bolus administration of intravenous contrast. Multiplanar CT image reconstructions and MIPs were obtained to evaluate the vascular anatomy. CONTRAST:  46mL OMNIPAQUE IOHEXOL 350 MG/ML SOLN COMPARISON:  None. FINDINGS: Cardiovascular: Evaluation is limited by respiratory motion artifact and patient body habitus.Given this limitation, no acute pulmonary embolism was detected. The main pulmonary artery is dilated measuring approximately 3.2 cm. The heart size is mild-to-moderately enlarged. The thoracic aorta is unremarkable. Mediastinum/Nodes: --No mediastinal or hilar lymphadenopathy. --No axillary lymphadenopathy. --No supraclavicular lymphadenopathy. --Normal thyroid gland. --The esophagus is unremarkable Lungs/Pleura: There are moderate-sized bilateral pleural effusions, right greater than left. There are hazy primarily perihilar  ground-glass airspace opacities with associated interlobular septal thickening. There is no pneumothorax. Upper Abdomen: No acute abnormality. Musculoskeletal: No chest wall abnormality. No acute or significant osseous findings. Review of the MIP images confirms the above findings. IMPRESSION: 1. Limited study as  detailed above. Given these limitations, no acute pulmonary embolism was detected. 2. Moderate bilateral pleural effusions with findings suggestive interstitial pulmonary edema. An underlying atypical infectious process can have a similar appearance. 3. Dilated main pulmonary artery which can be seen in patients with elevated pulmonary artery pressures. Electronically Signed   By: Constance Holster M.D.   On: 07/01/2019 17:22    MAU Management/MDM: Orders Placed This Encounter  Procedures  . Respiratory Panel by RT PCR (Flu A&B, Covid) - Nasopharyngeal Swab  . CT Angio Chest PE W and/or Wo Contrast  . Urinalysis, Routine w reflex microscopic  . CBC  . Comprehensive metabolic panel  . Protein / creatinine ratio, urine  . CBC  . Creatinine, serum  . Diet regular Room service appropriate? Yes; Fluid consistency: Thin  . Notify Physician  . Measure blood pressure  . Notify Physician  . Initiate Oral Care Protocol  . Initiate Carrier Fluid Protocol  . SCDs  . Measure blood pressure  . Vital signs  . Strict intake and output  . Full code  . Oxygen therapy Mode or (Route): Nasal cannula; Liters Per Minute: 2; Keep 02 saturation: 90  . ED EKG  . Admit to Inpatient (patient's expected length of stay will be greater than 2 midnights or inpatient only procedure)    Meds ordered this encounter  Medications  . FOLLOWED BY Linked Order Group   . lactated ringers bolus 1,000 mL   . diphenhydrAMINE (BENADRYL) injection 25 mg   . metoCLOPramide (REGLAN) injection 10 mg   . dexamethasone (DECADRON) injection 10 mg  . AND Linked Order Group   . labetalol (NORMODYNE) injection 20 mg   . labetalol (NORMODYNE) injection 40 mg   . labetalol (NORMODYNE) injection 80 mg   . hydrALAZINE (APRESOLINE) injection 10 mg  . enoxaparin (LOVENOX) injection 40 mg  . DISCONTD: labetalol (NORMODYNE) injection 20 mg  . DISCONTD: labetalol (NORMODYNE) injection 40 mg  . DISCONTD: labetalol (NORMODYNE)  injection 80 mg  . DISCONTD: hydrALAZINE (APRESOLINE) injection 10 mg  . magnesium bolus via infusion 4 g  . magnesium sulfate 40 grams in SWI 1000 mL OB infusion  . iohexol (OMNIPAQUE) 350 MG/ML injection 50 mL  . lactated ringers infusion  . furosemide (LASIX) injection 40 mg    Pt presented with headache as primary symptom with shortness of breath noted yesterday, but while in MAU became increasingly more tachypnic and short of breath.  O2 sats dropped to 87 % but improved with 2L O2 via [place].  CT scan ordered, results rule out PE but indicate pulmonary hypertension. Pt with HTN and headache, PEC labs ordered and results with elevated liver enzymes.  Headache not resolved with IV fluids, Benadryl, Decadron, and Reglan.   Dr Wilhelmenia Blase called and to bedside to admit patient. Pt stable for transfer to [place] unit.   ASSESSMENT 1. Tachypnea   2. Shortness of breath   3. Orthostatic headache   4. Postpartum hypertension     PLAN Admit to [facility] unit Dr Wilhelmenia Blase at bedside in [place] Certified Nurse-Midwife 07/01/2019  6:17 PM

## 2019-07-01 NOTE — MAU Note (Signed)
EKG in process per order; Rapid Response called to bedside for further evaluation.

## 2019-07-01 NOTE — MAU Note (Signed)
Dana Smith is a 34 y.o. here in MAU reporting: woke up this AM around 0900 with a headache. Took some pain medicine (oxycodone and 1 extra strength tylenol). Tried to eat and vomited everything back up. Headache is better when standing and gets worse when sitting or lying down. Emesis x 4 today.  Onset of complaint: today  Pain score: 7/10  Vitals:   07/01/19 1446  BP: (!) 146/88  Pulse: 61  Resp: (!) 36  Temp: 98.6 F (37 C)  SpO2: 99%     Lab orders placed from triage: none

## 2019-07-01 NOTE — H&P (Signed)
Dana Smith is a 34 y.o. 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 Claims she had a long bout of N/V last night and presented today because of strong throbbing headache and continued emesis that ws not allowing her to take PO pain meds post op. Is pumping/breast-feeding.  After initial assessment, patient noted to be tachypnic and required [place] given O2 sats in high 80s. Sent to CT where an acute PE was ruled out but imaging was s/f BL pleural effusions raising concern for ppPreE. Denies fever, chills and sick contacts as well   OB History    Gravida  2   Para  1   Term  1   Preterm      AB  1   Living  1     SAB  0   TAB      Ectopic  1   Multiple  0   Live Births  1          Past Medical History:  Diagnosis Date  . Arrest of dilation, delivered, current hospitalization 06/25/2019  . Fibroid   . Gestational diabetes    metformin   Past Surgical History:  Procedure Laterality Date  . CESAREAN SECTION N/A 06/25/2019   Procedure: CESAREAN SECTION;  Surgeon: Sherlyn Hay, DO;  Location: [facility];  Service: Obstetrics;  Laterality: N/A;  . LAPAROSCOPY Left 08/11/2018   Procedure: LAPAROSCOPY DIAGNOSTIC, LEFT SALPINGECTOMY WITH REMOVAL OF ECTOPIC PREGNANCY;  Surgeon: Sherlyn Hay, DO;  Location: [place];  Service: Gynecology;  Laterality: Left;  . WISDOM TOOTH EXTRACTION Bilateral    Family History: family history includes Healthy in her father and mother. Social History:  reports that she has never smoked. She has never used smokeless tobacco. She reports previous alcohol use. She reports that she does not use drugs.   Review of Systems  Constitutional: Positive for fatigue. Negative for chills and fever.  Respiratory: Positive for shortness of breath. Negative for cough, choking and chest tightness.   Cardiovascular: Positive for leg swelling. Negative for chest pain  and palpitations.  Gastrointestinal: Negative for abdominal distention, abdominal pain and vomiting.  Genitourinary: Positive for vaginal bleeding (c/w pp lochia). Negative for decreased urine volume, flank pain, frequency and pelvic pain.  Skin: Negative for pallor.  Neurological: Positive for headaches. Negative for dizziness, syncope, weakness and light-headedness.  Psychiatric/Behavioral: Negative for suicidal ideas. The patient is nervous/anxious.      Blood pressure (!) 162/78, pulse 67, temperature 98.6 F (37 C), temperature source Oral, resp. rate (!) 34, height 5\' 6"  (1.676 m), weight 129.3 kg, SpO2 91 %, unknown if currently breastfeeding.   Physical Exam  Constitutional: She is oriented to person, place, and time. She appears well-developed and well-nourished. She appears ill (increased work of breathing). No distress.  HENT:  Head: Normocephalic and atraumatic.  Eyes: Pupils are equal, round, and reactive to light. Right eye exhibits no discharge. Left eye exhibits no discharge. No scleral icterus.  Cardiovascular: Normal rate and regular rhythm. Exam reveals no gallop.  No murmur heard. Respiratory: Tachypnea noted. She has decreased breath sounds in the right lower field and the left lower field. She has rales in the right lower field and the left lower field.  No accessory muscle use however visible increased work of breathing, Pope in place during exam  GI: There is no abdominal tenderness. There is no rebound and no guarding.  Musculoskeletal:        General: Edema (1+ BL pedal) present. Normal range of motion.     Cervical back: Normal range of motion and neck supple.  Neurological: She is alert and oriented to person, place, and time. She displays normal reflexes. She exhibits normal muscle tone. Coordination normal.  Skin: Skin is warm and dry.    Prenatal labs: ABO, Rh: --/--/O POS (05/02 0020) Antibody: NEG (05/02 0020) Rubella: Immune (10/19 0000) RPR: NON  REACTIVE (05/02 0020)  HBsAg: Negative (10/19 0000)  HIV: Non-reactive (02/16 0000)  GBS: Positive/-- (04/20 0000)   Assessment/Plan: This is a 34yo G2P1011 POD#6 s/p PTLCS for arrest of dilation/descent presenting with HA, tachypnea, BL pleural effusions in the setting of elevated blood pressures consistent with postpartum PreE. S/p CT-A which was negative for acute PE.    Admit to [facility] specialty care, MgSo4 ordered 4gm bolus then 2gm maintenance. Strict I/O. PreE labs s/f elevated transaminases at 52/68. Cr stable at 0.86. Rest of [place] labs WNL, mild anemia with Hgb 10.1.  Pump to be provided for infant.   One-time dose of Lasix IV 40, pp Lovenox ordered  Deliah Boston 07/01/2019, 5:19 PM

## 2019-07-02 ENCOUNTER — Ambulatory Visit: Payer: Self-pay

## 2019-07-02 LAB — CBC
HCT: 31.2 % — ABNORMAL LOW (ref 36.0–46.0)
Hemoglobin: 9.9 g/dL — ABNORMAL LOW (ref 12.0–15.0)
MCH: 28.2 pg (ref 26.0–34.0)
MCHC: 31.7 g/dL (ref 30.0–36.0)
MCV: 88.9 fL (ref 80.0–100.0)
Platelets: 433 10*3/uL — ABNORMAL HIGH (ref 150–400)
RBC: 3.51 MIL/uL — ABNORMAL LOW (ref 3.87–5.11)
RDW: 14.3 % (ref 11.5–15.5)
WBC: 10.9 10*3/uL — ABNORMAL HIGH (ref 4.0–10.5)
nRBC: 0 % (ref 0.0–0.2)

## 2019-07-02 LAB — HEPATIC FUNCTION PANEL
ALT: 53 U/L — ABNORMAL HIGH (ref 0–44)
AST: 32 U/L (ref 15–41)
Albumin: 2.4 g/dL — ABNORMAL LOW (ref 3.5–5.0)
Alkaline Phosphatase: 176 U/L — ABNORMAL HIGH (ref 38–126)
Bilirubin, Direct: <0.1 mg/dL (ref 0.0–0.2)
Total Bilirubin: 0.5 mg/dL (ref 0.3–1.2)
Total Protein: 5.8 g/dL — ABNORMAL LOW (ref 6.5–8.1)

## 2019-07-02 LAB — BRAIN NATRIURETIC PEPTIDE: B Natriuretic Peptide: 342.6 pg/mL — ABNORMAL HIGH (ref 0.0–100.0)

## 2019-07-02 MED ORDER — ACETAMINOPHEN 500 MG PO TABS
1000.0000 mg | ORAL_TABLET | Freq: Once | ORAL | Status: AC
  Administered 2019-07-02: 1000 mg via ORAL
  Filled 2019-07-02: qty 2

## 2019-07-02 MED ORDER — ACETAMINOPHEN 325 MG PO TABS: 650.0000 mg | ORAL_TABLET | Freq: Four times a day (QID) | ORAL | Status: DC | PRN

## 2019-07-02 NOTE — Progress Notes (Signed)
Patient ID: Dana Smith, female   DOB: 1985/05/13, 34 y.o.   MRN: [id number] Pt doing well. Reports feels significantly better. She is bonding well with baby. Denies any new complaints. Ambulating and tolerating diet. No HA or SOB VS: 124/67, 75, 98.1, 18, 95%RA GEN - NAD ABD - FF EXT - no homans  Output - 8L urine in past 16hrs  A/P: HD#2 and PPD#7 s/p prime c/s for arrest of descent and dilation - developed pulmonary edema, pp preecclampsia; hx GDMA2 - now stable  - BP stable : Continue on MgSO4 till 1700 today then stop; monitor BP                      Continue to monitor I/Os  -Pulmonary edema- hold decadron                                 -Hold lasix                                 - Continue to monitor I/Os  - Elevated BMI - lovenox daily

## 2019-07-02 NOTE — Progress Notes (Signed)
Patient feels improved after mag bolus, has started diuresing significantly. Sats in upper 90s on RA with addition of incentive spirometry, lung exam clearing in BL lower fields. Bps normotensive, last IV antihypertensive at 1639 (hydral 10)

## 2019-07-02 NOTE — Progress Notes (Addendum)
Patient ID: Emilyelizabeth Battistone, female   DOB: 02-08-1986, 34 y.o.   MRN: [id number] Pt seen. Family at bedside Pt with no complaints except mild HA that begun on MgS04 - if persists after eats dinner to notify RN.  Will check labs this pm. Will continue to monitor BP and symptoms closely: BP q 4hrs Cousin mentioned noting one LE >another. On exam +2pitting edema in both LE, equal color and temp, right calf slightly >left; no homans. Will monitor. Answered questions regarding plan of care and expectations on discharge Will check BNP given still occasionally tachypneic

## 2019-07-02 NOTE — Progress Notes (Signed)
Patient ID: Dana Smith, female   DOB: 1986/01/11, 34 y.o.   MRN: [id number] Pt with 1.2L more diuresed since this am for a total of >9L urine output. Feels well. VSS No complaints  Plan : stop MgSo4 at 1700           Monitor BP - start on medication if indicated           Monitor overnight and discharge to home in am if able

## 2019-07-03 NOTE — Discharge Instructions (Signed)
Call for increasing SOB or headache

## 2019-07-03 NOTE — Progress Notes (Signed)
HD#3, POD #8 LTCS, postpartum pulmonary edema Feeling much better today, no problems Afeb, VSS, BP normal Abd- soft, fundus firm, incision intact  I'm not sure she had preeclampsia, did not have proteinuria, but at least had pulmonary edema which has resolved, feeling better, normal BP.  Will d/c home today

## 2019-07-03 NOTE — Discharge Summary (Signed)
Physician Discharge Summary  Patient ID: Dana Smith MRN: [id number] DOB/AGE: 1985-04-16 34 y.o.  Admit date: 07/01/2019 Discharge date: 07/03/2019  Admission Diagnoses: Postpartum pulmonary edema, possible preeclampsia  Discharge Diagnoses: Same Active Problems:   Preeclampsia in postpartum period   Discharged Condition: good  Hospital Course: Pt admitted by Dr. Wilhelmenia Blase on POD #6 LTCS for SOB with pulmonary edema and a few elevated BP.  She required treatment of BP initially, CT scan of chest r/o PE but with pulmonary edema.  She was admitted, put on magnesium for seizure prophylaxis, Lasix for diuresis, required no oral anithypertensives and BP remained normal through the rest of her stay.  Labs normal except for mild elevation in LFTs, no proteinuria.  On HD #3, she had diuresed 9+ liters of fluid, felt much better, normal BP, stable for discharge   Discharge Exam: Blood pressure 124/83, pulse 75, temperature 98.2 F (36.8 C), temperature source Oral, resp. rate 16, height 5\' 6"  (1.676 m), weight 129.3 kg, SpO2 97 %, unknown if currently breastfeeding. General appearance: alert  Disposition: Discharge disposition: 01-Home or Self Care       Discharge Instructions    Diet - low sodium heart healthy   Complete by: As directed    Lifting restrictions   Complete by: As directed    Weight restriction of 10 lbs.     Allergies as of 07/03/2019   No Known Allergies     Medication List    TAKE these medications   acetaminophen 500 MG tablet Commonly known as: TYLENOL Take 1,000 mg by mouth every 6 (six) hours as needed for mild pain or headache.   diphenhydrAMINE-zinc acetate cream Commonly known as: BENADRYL Apply 1 application topically 3 (three) times daily as needed for itching.   ibuprofen 800 MG tablet Commonly known as: ADVIL Take 1 tablet (800 mg total) by mouth every 8 (eight) hours as needed.   oxyCODONE 5 MG immediate release tablet Commonly  known as: Oxy IR/ROXICODONE Take 1-2 tablets (5-10 mg total) by mouth every 6 (six) hours as needed for moderate pain.      Follow-up Information    Sherlyn Hay, DO. Schedule an appointment as soon as possible for a visit in 1 week(s).   Specialty: Obstetrics and Gynecology Contact information: [address] [place] [address] [phone]           Signed: Blane Ohara Laticha Ferrucci 07/03/2019, 8:39 AM

## 2019-07-07 ENCOUNTER — Ambulatory Visit: Payer: No Typology Code available for payment source | Attending: Internal Medicine

## 2019-07-07 DIAGNOSIS — Z23 Encounter for immunization: Secondary | ICD-10-CM

## 2019-07-07 NOTE — Progress Notes (Signed)
   Covid-19 Vaccination Clinic  Name:  Darrell Jelley    MRN: [id number] DOB: December 28, 1985  07/07/2019  Ms. Bent was observed post Covid-19 immunization for 15 minutes without incident. She was provided with Vaccine Information Sheet and instruction to access the V-Safe system.   Ms. Morrell was instructed to call 911 with any severe reactions post vaccine: Marland Kitchen Difficulty breathing  . Swelling of face and throat  . A fast heartbeat  . A bad rash all over body  . Dizziness and weakness   Immunizations Administered    Name Date Dose VIS Date Route   Pfizer COVID-19 Vaccine 07/07/2019 11:12 AM 0.3 mL 04/18/2018 Intramuscular   Manufacturer: [place]   Lot: [id number]   [place]: [id number]

## 2019-07-09 ENCOUNTER — Other Ambulatory Visit: Payer: Self-pay | Admitting: Physician Assistant

## 2019-07-09 ENCOUNTER — Inpatient Hospital Stay (HOSPITAL_COMMUNITY)
Admission: AD | Admit: 2019-07-09 | Discharge: 2019-07-09 | Disposition: A | Discharge status: Home / Self Care | Payer: No Typology Code available for payment source | Attending: Obstetrics and Gynecology | Admitting: Obstetrics and Gynecology

## 2019-07-09 ENCOUNTER — Inpatient Hospital Stay (HOSPITAL_COMMUNITY): Payer: No Typology Code available for payment source

## 2019-07-09 ENCOUNTER — Encounter (HOSPITAL_COMMUNITY): Payer: Self-pay | Admitting: Obstetrics and Gynecology

## 2019-07-09 ENCOUNTER — Other Ambulatory Visit: Payer: Self-pay

## 2019-07-09 DIAGNOSIS — O8600 Infection of obstetric surgical wound, unspecified: Secondary | ICD-10-CM | POA: Diagnosis not present

## 2019-07-09 DIAGNOSIS — O1495 Unspecified pre-eclampsia, complicating the puerperium: Secondary | ICD-10-CM

## 2019-07-09 DIAGNOSIS — Z8632 Personal history of gestational diabetes: Secondary | ICD-10-CM | POA: Insufficient documentation

## 2019-07-09 DIAGNOSIS — R000 Tachycardia, unspecified: Secondary | ICD-10-CM | POA: Insufficient documentation

## 2019-07-09 DIAGNOSIS — Z20822 Contact with and (suspected) exposure to covid-19: Secondary | ICD-10-CM | POA: Diagnosis not present

## 2019-07-09 DIAGNOSIS — R059 Cough, unspecified: Secondary | ICD-10-CM

## 2019-07-09 DIAGNOSIS — R05 Cough: Secondary | ICD-10-CM | POA: Diagnosis not present

## 2019-07-09 DIAGNOSIS — O9089 Other complications of the puerperium, not elsewhere classified: Secondary | ICD-10-CM

## 2019-07-09 DIAGNOSIS — R079 Chest pain, unspecified: Secondary | ICD-10-CM | POA: Diagnosis not present

## 2019-07-09 DIAGNOSIS — O99893 Other specified diseases and conditions complicating puerperium: Secondary | ICD-10-CM

## 2019-07-09 LAB — CBC WITH DIFFERENTIAL/PLATELET
Abs Immature Granulocytes: 0.04 10*3/uL (ref 0.00–0.07)
Basophils Absolute: 0.1 10*3/uL (ref 0.0–0.1)
Basophils Relative: 1 %
Eosinophils Absolute: 0.4 10*3/uL (ref 0.0–0.5)
Eosinophils Relative: 4 %
HCT: 38.7 % (ref 36.0–46.0)
Hemoglobin: 12.1 g/dL (ref 12.0–15.0)
Immature Granulocytes: 0 %
Lymphocytes Relative: 20 %
Lymphs Abs: 1.9 10*3/uL (ref 0.7–4.0)
MCH: 27.9 pg (ref 26.0–34.0)
MCHC: 31.3 g/dL (ref 30.0–36.0)
MCV: 89.2 fL (ref 80.0–100.0)
Monocytes Absolute: 0.7 10*3/uL (ref 0.1–1.0)
Monocytes Relative: 7 %
Neutro Abs: 6.6 10*3/uL (ref 1.7–7.7)
Neutrophils Relative %: 68 %
Platelets: 497 10*3/uL — ABNORMAL HIGH (ref 150–400)
RBC: 4.34 MIL/uL (ref 3.87–5.11)
RDW: 13.6 % (ref 11.5–15.5)
WBC: 9.6 10*3/uL (ref 4.0–10.5)
nRBC: 0 % (ref 0.0–0.2)

## 2019-07-09 LAB — COMPREHENSIVE METABOLIC PANEL
ALT: 32 U/L (ref 0–44)
AST: 26 U/L (ref 15–41)
Albumin: 3.3 g/dL — ABNORMAL LOW (ref 3.5–5.0)
Alkaline Phosphatase: 154 U/L — ABNORMAL HIGH (ref 38–126)
Anion gap: 11 (ref 5–15)
BUN: 13 mg/dL (ref 6–20)
CO2: 25 mmol/L (ref 22–32)
Calcium: 9 mg/dL (ref 8.9–10.3)
Chloride: 104 mmol/L (ref 98–111)
Creatinine, Ser: 0.91 mg/dL (ref 0.44–1.00)
GFR calc Af Amer: >60 mL/min (ref >60)
GFR calc non Af Amer: >60 mL/min (ref >60)
Glucose, Bld: 107 mg/dL — ABNORMAL HIGH (ref 70–99)
Potassium: 4.1 mmol/L (ref 3.5–5.1)
Sodium: 140 mmol/L (ref 135–145)
Total Bilirubin: 0.7 mg/dL (ref 0.3–1.2)
Total Protein: 7.1 g/dL (ref 6.5–8.1)

## 2019-07-09 LAB — SARS CORONAVIRUS 2 BY RT PCR (HOSPITAL ORDER, PERFORMED IN ~~LOC~~ HOSPITAL LAB): SARS Coronavirus 2: NEGATIVE

## 2019-07-09 LAB — TROPONIN I (HIGH SENSITIVITY): Troponin I (High Sensitivity): 3 ng/L (ref <18)

## 2019-07-09 LAB — BRAIN NATRIURETIC PEPTIDE: B Natriuretic Peptide: 29.2 pg/mL (ref 0.0–100.0)

## 2019-07-09 MED ORDER — SULFAMETHOXAZOLE-TRIMETHOPRIM 800-160 MG PO TABS: 1.0000 | ORAL_TABLET | Freq: Two times a day (BID) | ORAL | 0 refills | Status: AC

## 2019-07-09 MED ORDER — HYDROCODONE-HOMATROPINE 5-1.5 MG/5ML PO SYRP: 5.0000 mL | ORAL_SOLUTION | Freq: Every evening | ORAL | 0 refills | Status: DC | PRN

## 2019-07-09 NOTE — MAU Note (Signed)
Pt presents to MAU from OB office due to cough. She delivered via c-section on 06/25/2019, then was readmitted for bilateral pleural effusion 07/01/2019. Here today to assess cough. Pt denies fever.

## 2019-07-09 NOTE — Discharge Instructions (Signed)
Cough, Adult A cough helps to clear your throat and lungs. A cough may be a sign of an illness or another medical condition. An acute cough may only last 2-3 weeks, while a chronic cough may last 8 or more weeks. Many things can cause a cough. They include:  Germs (viruses or bacteria) that attack the airway.  Breathing in things that bother (irritate) your lungs.  Allergies.  Asthma.  Mucus that runs down the back of your throat (postnasal drip).  Smoking.  Acid backing up from the stomach into the tube that moves food from the mouth to the stomach (gastroesophageal reflux).  Some medicines.  Lung problems.  Other medical conditions, such as heart failure or a blood clot in the lung (pulmonary embolism). Follow these instructions at home: Medicines  Take over-the-counter and prescription medicines only as told by your doctor.  Talk with your doctor before you take medicines that stop a cough (coughsuppressants). Lifestyle   Do not smoke, and try not to be around smoke. Do not use any products that contain nicotine or tobacco, such as cigarettes, e-cigarettes, and chewing tobacco. If you need help quitting, ask your doctor.  Drink enough fluid to keep your pee (urine) pale yellow.  Avoid caffeine.  Do not drink alcohol if your doctor tells you not to drink. General instructions   Watch for any changes in your cough. Tell your doctor about them.  Always cover your mouth when you cough.  Stay away from things that make you cough, such as perfume, candles, campfire smoke, or cleaning products.  If the air is dry, use a cool mist vaporizer or humidifier in your home.  If your cough is worse at night, try using extra pillows to raise your head up higher while you sleep.  Rest as needed.  Keep all follow-up visits as told by your doctor. This is important. Contact a doctor if:  You have new symptoms.  You cough up pus.  Your cough does not get better after 2-3  weeks, or your cough gets worse.  Cough medicine does not help your cough and you are not sleeping well.  You have pain that gets worse or pain that is not helped with medicine.  You have a fever.  You are losing weight and you do not know why.  You have night sweats. Get help right away if:  You cough up blood.  You have trouble breathing.  Your heartbeat is very fast. These symptoms may be an emergency. Do not wait to see if the symptoms will go away. Get medical help right away. Call your local emergency services (911 in the [place] Do not drive yourself to the hospital. Summary  A cough helps to clear your throat and lungs. Many things can cause a cough.  Take over-the-counter and prescription medicines only as told by your doctor.  Always cover your mouth when you cough.  Contact a doctor if you have new symptoms or you have a cough that does not get better or gets worse. This information is not intended to replace advice given to you by your health care provider. Make sure you discuss any questions you have with your health care provider. Document Revised: 02/27/2018 Document Reviewed: 02/27/2018 Elsevier Patient Education  [place].  Wound Infection A wound infection happens when germs start to grow in a wound. Germs that cause wound infections are most often bacteria. Other types of infections can occur as well. An infection can cause the  wound to break open. Wound infections need treatment. If a wound infection is not treated, problems can happen. What are the causes?  Most often caused by germs (bacteria) that grow in a wound.  Other germs, such as yeast and funguses, can also cause wound infections. What increases the risk?  Having a weak body defense system (immune system).  Having diabetes.  Taking certain medicines (steroids) for a long time.  Smoking.  Being an older person.  Being overweight.  Taking certain medicines for cancer  treatment. What are the signs or symptoms?  Having more redness, swelling, or pain at the wound site.  Having more blood or fluid at the wound site.  A bad smell coming from a wound or bandage (dressing).  Having a fever.  Feeling very tired.  Having warmth at or around the wound.  Having pus at the wound site. How is this treated?  This condition is most often treated with an antibiotic medicine. ? The infection should improve 24-48 hours after you start antibiotics. ? After 24-48 hours, redness around the wound should stop spreading. The wound should also be less painful. Follow these instructions at home: Medicines  Take or apply over-the-counter and prescription medicines only as told by your doctor.  If you were prescribed an antibiotic medicine, take or apply it as told by your doctor. Do not stop using the antibiotic even if you start to feel better. Wound care   Clean the wound each day, or as told by your doctor. ? Wash the wound with mild soap and water. ? Rinse the wound with water to remove all soap. ? Pat the wound dry with a clean towel. Do not rub it.  Follow instructions from your doctor about how to take care of your wound. Make sure you: ? Wash your hands with soap and water before and after you change your bandage. If you cannot use soap and water, use hand sanitizer. ? Change your bandage as told by your doctor. ? Leave stitches (sutures), skin glue, or skin tape (adhesive) strips in place if your wound has been closed. They may need to stay in place for 2 weeks or longer. If tape strips get loose and curl up, you may trim the loose edges. Do not remove tape strips completely unless your doctor says it is okay. Some wounds are left open to heal on their own.  Check your wound every day for signs of infection. Watch for: ? More redness, swelling, or pain. ? More fluid or blood. ? Warmth. ? Pus or a bad smell. General instructions  Keep the bandage dry  until your doctor says it can be removed.  Do not take baths, swim, or use a hot tub until your doctor approves. Ask your doctor if you may take showers. You may only be allowed to take sponge baths.  Raise (elevate) the injured area above the level of your heart while you are sitting or lying down.  Do not scratch or pick at the wound.  Keep all follow-up visits as told by your doctor. This is important. Contact a doctor if:  Medicine does not help your pain.  You have more redness, swelling, or pain around your wound.  You have more fluid or blood coming from your wound.  Your wound feels warm to the touch.  You have pus coming from your wound.  You notice a bad smell coming from your wound or your bandage.  Your wound that was closed breaks  open. Get help right away if:  You have a red streak going away from your wound.  You have a fever. Summary  A wound infection happens when germs start to grow in a wound.  This condition is usually treated with an antibiotic medicine.  Follow instructions from your doctor about how to take care of your wound.  Contact a doctor if your wound infection does not start to get better in 24-48 hours, or your symptoms get worse.  Keep all follow-up visits as told by your doctor. This is important. This information is not intended to replace advice given to you by your health care provider. Make sure you discuss any questions you have with your health care provider. Document Revised: 09/20/2017 Document Reviewed: 09/20/2017 Elsevier Patient Education  [place].

## 2019-07-09 NOTE — MAU Provider Note (Signed)
Chief Complaint: Cough   First Provider Initiated Contact with Patient 07/09/19 1837     SUBJECTIVE HPI: Dana Smith is a 34 y.o. G2P1011 at 2 weeks postpartum who presents to [facility] reporting cough and chest pain.  She was readmitted to the hospital on May 9 for pulmonary edema and was diuresed 9+ liters of fluid.  Was seen in the office by Dr. Terri Piedra today and sent here for further evaluation due to worsening cough and chest pain. Patient reports she has been coughing since she was discharged from the hospital but it has slightly worsened.  Cough is productive of clear phlegm.  It is slightly improved during the day with Robitussin and Vicks VapoRub but is worse at night.  Recently reports a burning pain in her mid chest that is worse with cough.  Also reports a slight sore throat when she coughs.  Denies fever or chills.  Received her second Covid vaccine this past Saturday.  Denies any sick contacts.  States she is only slightly short of breath when she has a coughing fit but otherwise denies shortness of breath.   Past Medical History:  Diagnosis Date  . Arrest of dilation, delivered, current hospitalization 06/25/2019  . Fibroid   . Gestational diabetes    metformin   OB History  Gravida Para Term Preterm AB Living  2 1 1   1 1   SAB TAB Ectopic Multiple Live Births  0   1 0 1    # Outcome Date GA Lbr Len/2nd Weight Sex Delivery Anes PTL Lv  2 Term 06/25/19 [redacted]w[redacted]d  2990 g F CS-LTranv EPI  LIV  1 Ectopic 07/2018           Past Surgical History:  Procedure Laterality Date  . CESAREAN SECTION N/A 06/25/2019   Procedure: CESAREAN SECTION;  Surgeon: Sherlyn Hay, DO;  Location: [facility];  Service: Obstetrics;  Laterality: N/A;  . LAPAROSCOPY Left 08/11/2018   Procedure: LAPAROSCOPY DIAGNOSTIC, LEFT SALPINGECTOMY WITH REMOVAL OF ECTOPIC PREGNANCY;  Surgeon: Sherlyn Hay, DO;  Location: [facility];  Service: Gynecology;  Laterality: Left;  . WISDOM TOOTH  EXTRACTION Bilateral    Social History   Socioeconomic History  . Marital status: Single    Spouse name: Not on file  . Number of children: Not on file  . Years of education: Not on file  . Highest education level: Not on file  Occupational History  . Not on file  Tobacco Use  . Smoking status: Never Smoker  . Smokeless tobacco: Never Used  Substance and Sexual Activity  . Alcohol use: Not Currently    Comment: Socially  . Drug use: Never  . Sexual activity: Not Currently  Other Topics Concern  . Not on file  Social History Narrative  . Not on file   Social Determinants of Health   Financial Resource Strain:   . Difficulty of Paying Living Expenses:   Food Insecurity:   . Worried About Charity fundraiser in the Last Year:   . Arboriculturist in the Last Year:   Transportation Needs:   . Film/video editor (Medical):   Marland Kitchen Lack of Transportation (Non-Medical):   Physical Activity:   . Days of Exercise per Week:   . Minutes of Exercise per Session:   Stress:   . Feeling of Stress :   Social Connections:   . Frequency of Communication with Friends and Family:   . Frequency of  Social Gatherings with Friends and Family:   . Attends Religious Services:   . Active Member of Clubs or Organizations:   . Attends Archivist Meetings:   Marland Kitchen Marital Status:   Intimate Partner Violence:   . Fear of Current or Ex-Partner:   . Emotionally Abused:   Marland Kitchen Physically Abused:   . Sexually Abused:    Family History  Problem Relation Age of Onset  . Healthy Mother   . Healthy Father    No current facility-administered medications on file prior to encounter.   Current Outpatient Medications on File Prior to Encounter  Medication Sig Dispense Refill  . acetaminophen (TYLENOL) 500 MG tablet Take 1,000 mg by mouth every 6 (six) hours as needed for mild pain or headache.     . diphenhydrAMINE-zinc acetate (BENADRYL) cream Apply 1 application topically 3 (three) times daily  as needed for itching.    Marland Kitchen ibuprofen (ADVIL) 800 MG tablet Take 1 tablet (800 mg total) by mouth every 8 (eight) hours as needed. 45 tablet 1  . oxyCODONE (OXY IR/ROXICODONE) 5 MG immediate release tablet Take 1-2 tablets (5-10 mg total) by mouth every 6 (six) hours as needed for moderate pain. 30 tablet 0   No Known Allergies  I have reviewed patient's Past Medical Hx, Surgical Hx, Family Hx, Social Hx, medications and allergies.   Review of Systems  Constitutional: Negative.   HENT: Positive for sore throat.        Denies anosmia  Respiratory: Positive for cough. Negative for shortness of breath and wheezing.   Cardiovascular: Positive for chest pain.  Gastrointestinal: Negative.     OBJECTIVE Patient Vitals for the past 24 hrs:  BP Temp Temp src Pulse Resp SpO2  07/09/19 1926 136/80 -- -- 99 -- --  07/09/19 1925 136/80 98.1 F (36.7 C) Oral 95 18 100 %  07/09/19 1739 132/82 98.1 F (36.7 C) Oral (!) 104 19 100 %   Constitutional: Well-developed, well-nourished female in no acute distress.  Cardiovascular: normal rate & rhythm, no murmur Respiratory: normal rate and effort. Lung sounds clear throughout GI: Abd soft, non-tender, Pos BS x 4. No guarding or rebound tenderness MS: Extremities nontender, no edema, normal ROM Neurologic: Alert and oriented x 4.  Skin: abdominal wound well approximated. Area of redness on left half of wound. No induration, discharge, warmth.     LAB RESULTS Results for orders placed or performed during the hospital encounter of 07/09/19 (from the past 24 hour(s))  SARS Coronavirus 2 by RT PCR (hospital order, performed in [facility] hospital lab) Nasopharyngeal Nasopharyngeal Swab     Status: None   Collection Time: 07/09/19  5:57 PM   Specimen: Nasopharyngeal Swab  Result Value Ref Range   SARS Coronavirus 2 NEGATIVE NEGATIVE  CBC with Differential/Platelet     Status: Abnormal   Collection Time: 07/09/19  6:25 PM  Result Value Ref Range    WBC 9.6 4.0 - 10.5 K/uL   RBC 4.34 3.87 - 5.11 MIL/uL   Hemoglobin 12.1 12.0 - 15.0 g/dL   HCT 38.7 36.0 - 46.0 %   MCV 89.2 80.0 - 100.0 fL   MCH 27.9 26.0 - 34.0 pg   MCHC 31.3 30.0 - 36.0 g/dL   RDW 13.6 11.5 - 15.5 %   Platelets 497 (H) 150 - 400 K/uL   nRBC 0.0 0.0 - 0.2 %   Neutrophils Relative % 68 %   Neutro Abs 6.6 1.7 - 7.7 K/uL  Lymphocytes Relative 20 %   Lymphs Abs 1.9 0.7 - 4.0 K/uL   Monocytes Relative 7 %   Monocytes Absolute 0.7 0.1 - 1.0 K/uL   Eosinophils Relative 4 %   Eosinophils Absolute 0.4 0.0 - 0.5 K/uL   Basophils Relative 1 %   Basophils Absolute 0.1 0.0 - 0.1 K/uL   Immature Granulocytes 0 %   Abs Immature Granulocytes 0.04 0.00 - 0.07 K/uL  Comprehensive metabolic panel     Status: Abnormal   Collection Time: 07/09/19  6:25 PM  Result Value Ref Range   Sodium 140 135 - 145 mmol/L   Potassium 4.1 3.5 - 5.1 mmol/L   Chloride 104 98 - 111 mmol/L   CO2 25 22 - 32 mmol/L   Glucose, Bld 107 (H) 70 - 99 mg/dL   BUN 13 6 - 20 mg/dL   Creatinine, Ser 0.91 0.44 - 1.00 mg/dL   Calcium 9.0 8.9 - 10.3 mg/dL   Total Protein 7.1 6.5 - 8.1 g/dL   Albumin 3.3 (L) 3.5 - 5.0 g/dL   AST 26 15 - 41 U/L   ALT 32 0 - 44 U/L   Alkaline Phosphatase 154 (H) 38 - 126 U/L   Total Bilirubin 0.7 0.3 - 1.2 mg/dL   GFR calc non Af Amer >60 >60 mL/min   GFR calc Af Amer >60 >60 mL/min   Anion gap 11 5 - 15  Brain natriuretic peptide     Status: None   Collection Time: 07/09/19  6:25 PM  Result Value Ref Range   B Natriuretic Peptide 29.2 0.0 - 100.0 pg/mL  Troponin I (High Sensitivity)     Status: None   Collection Time: 07/09/19  6:25 PM  Result Value Ref Range   Troponin I (High Sensitivity) 3 <18 ng/L    IMAGING DG Chest 1 View  Result Date: 07/09/2019 CLINICAL DATA:  Chest pain, shortness of breath, cough EXAM: CHEST  1 VIEW COMPARISON:  None. FINDINGS: The heart size and mediastinal contours are within normal limits. Both lungs are clear. The visualized  skeletal structures are unremarkable. IMPRESSION: Normal study. Electronically Signed   By: Rolm Baptise M.D.   On: 07/09/2019 18:26    MAU COURSE Orders Placed This Encounter  Procedures  . SARS Coronavirus 2 by RT PCR (hospital order, performed in [facility] hospital lab) Nasopharyngeal Nasopharyngeal Swab  . DG Chest 1 View  . CBC with Differential/Platelet  . Comprehensive metabolic panel  . Brain natriuretic peptide  . ED EKG  . Discharge patient   Meds ordered this encounter  Medications  . sulfamethoxazole-trimethoprim (BACTRIM DS) 800-160 MG tablet    Sig: Take 1 tablet by mouth 2 (two) times daily for 7 days.    Dispense:  14 tablet    Refill:  0    Order Specific Question:   Supervising Provider    Answer:   Aletha Halim [id number]  . HYDROcodone-homatropine (HYCODAN) 5-1.5 MG/5ML syrup    Sig: Take 5 mLs by mouth at bedtime as needed for cough.    Dispense:  120 mL    Refill:  0    Order Specific Question:   Supervising Provider    Answer:   Aletha Halim [id number]    MDM Patient in no apparent distress and vital signs stable.  Oxygen saturation at 100%.  Just slightly tachycardic in the low 100s.  Lung sounds are clear throughout.  Patient with some episodes of cough during my exam, sounds dry. EKG  is sinus tachycardia. Chest x-ray shows no evidence of pneumonia. Labs normal, Covid negative, and BNP has returned to a normal range.  Consulted with Drs. Pickens and Shivaji.  Patient is stable for discharge at this time. Echo not available in MAU at night. Cardiology able to schedule patient a f/u appointment next week & will set up for an outpatient echo.   Will tx for wound infection per Dr. Ivor Costa request.   ASSESSMENT 1. Cough   2. Chest pain   3. Wound infection following cesarean section, postpartum   4. Postpartum complication     PLAN Discharge home in stable condition. Rx bactrim Rx hycodan prn See cardiology next week Discussed reasons  to return to MAU vs ED   Allergies as of 07/09/2019   No Known Allergies     Medication List    TAKE these medications   acetaminophen 500 MG tablet Commonly known as: TYLENOL Take 1,000 mg by mouth every 6 (six) hours as needed for mild pain or headache.   diphenhydrAMINE-zinc acetate cream Commonly known as: BENADRYL Apply 1 application topically 3 (three) times daily as needed for itching.   HYDROcodone-homatropine 5-1.5 MG/5ML syrup Commonly known as: Hycodan Take 5 mLs by mouth at bedtime as needed for cough.   ibuprofen 800 MG tablet Commonly known as: ADVIL Take 1 tablet (800 mg total) by mouth every 8 (eight) hours as needed.   oxyCODONE 5 MG immediate release tablet Commonly known as: Oxy IR/ROXICODONE Take 1-2 tablets (5-10 mg total) by mouth every 6 (six) hours as needed for moderate pain.   sulfamethoxazole-trimethoprim 800-160 MG tablet Commonly known as: BACTRIM DS Take 1 tablet by mouth 2 (two) times daily for 7 days.        Jorje Guild, NP 07/09/2019  8:02 PM

## 2019-07-15 NOTE — Progress Notes (Signed)
Cardiology Office Note:   Date:  07/18/2019  NAME:  Dana Smith    MRN: [id number] DOB:  1985/10/03   PCP:  Patient, No Pcp Per  Cardiologist:  No primary care provider on file.   Referring MD: No ref. provider found   Chief Complaint  Patient presents with  . Shortness of Breath   History of Present Illness:   Dana Smith is a 34 y.o. female with a hx of HTN who is being seen today for the evaluation of pulmonary edema. She was admitted 5/9-5/11 for pulmonary edema, elevated BNP 6 days after delivery. There was concerns for pre-eclampsia. CT PE study negative and with elevated BP and volume overload. She was diuresed up to 9 L.  She reports she had her delivery on 06/25/2019.  She did have a C-section and had a baby girl.  Apparently when she went home she was having progressively worsening shortness of breath for a few days prior to her admission on 07/01/2019.  She reports that her main symptom was headache that would not go away.  She reports her headache was worse when lying down and improved with sitting up.  She did go to the emergency room and was found to have volume overload.  She was diuresed up to 9 L and she felt better.  She reports she is continue to lose up to 30 pounds since her delivery and is feeling much better.  She is walking up to 1-1.5 miles per day.  She describes no chest pain or shortness of breath with exertion.  She reports nothing unusual about her pregnancy.  She was unable to complete her gestational diabetes test and was treated preemptively for gestational diabetes.  She had no high blood pressure or preeclampsia.  She was noticeably hypertensive when she was in the emergency room with systolic blood pressure in the 140-160 range.  She did have a CT PE study that was negative for pulmonary embolism.  Her BNP values were elevated up to 342 but did become normal on most recent evaluation on 07/09/2019.  She is never had a history of heart failure and  there is no history of cardiomyopathy in the family.  Her BMI is 41 and she is working on losing weight.  She does snore but there are no concerns for sleep apnea this time.  I did review her EKG from 07/09/2019 which shows sinus tachycardia with a heart rate of 102, left atrial normality noted.  She does have an echocardiogram planned for 08/03/2019.  She seems to be back to normal at today's visit.  She is a never smoker.  She rarely consumes alcohol.  No illicit drug use reported.  Past Medical History: Past Medical History:  Diagnosis Date  . Arrest of dilation, delivered, current hospitalization 06/25/2019  . Fibroid   . Gestational diabetes    metformin    Past Surgical History: Past Surgical History:  Procedure Laterality Date  . CESAREAN SECTION N/A 06/25/2019   Procedure: CESAREAN SECTION;  Surgeon: Sherlyn Hay, DO;  Location: [facility];  Service: Obstetrics;  Laterality: N/A;  . LAPAROSCOPY Left 08/11/2018   Procedure: LAPAROSCOPY DIAGNOSTIC, LEFT SALPINGECTOMY WITH REMOVAL OF ECTOPIC PREGNANCY;  Surgeon: Sherlyn Hay, DO;  Location: [facility];  Service: Gynecology;  Laterality: Left;  . WISDOM TOOTH EXTRACTION Bilateral     Current Medications: Current Meds  Medication Sig  . acetaminophen (TYLENOL) 500 MG tablet Take 1,000 mg by mouth every  6 (six) hours as needed for mild pain or headache.   . diphenhydrAMINE-zinc acetate (BENADRYL) cream Apply 1 application topically 3 (three) times daily as needed for itching.  Marland Kitchen HYDROcodone-homatropine (HYCODAN) 5-1.5 MG/5ML syrup Take 5 mLs by mouth at bedtime as needed for cough.  Marland Kitchen ibuprofen (ADVIL) 800 MG tablet Take 1 tablet (800 mg total) by mouth every 8 (eight) hours as needed.  Marland Kitchen oxyCODONE (OXY IR/ROXICODONE) 5 MG immediate release tablet Take 1-2 tablets (5-10 mg total) by mouth every 6 (six) hours as needed for moderate pain.     Allergies:    Patient has no known allergies.   Social History: Social History    Socioeconomic History  . Marital status: Single    Spouse name: Not on file  . Number of children: 1  . Years of education: Not on file  . Highest education level: Not on file  Occupational History  . Occupation: Geophysical data processor  Tobacco Use  . Smoking status: Never Smoker  . Smokeless tobacco: Never Used  Substance and Sexual Activity  . Alcohol use: Not Currently    Comment: Socially  . Drug use: Never  . Sexual activity: Not Currently  Other Topics Concern  . Not on file  Social History Narrative  . Not on file   Social Determinants of Health   Financial Resource Strain:   . Difficulty of Paying Living Expenses:   Food Insecurity:   . Worried About Charity fundraiser in the Last Year:   . Arboriculturist in the Last Year:   Transportation Needs:   . Film/video editor (Medical):   Marland Kitchen Lack of Transportation (Non-Medical):   Physical Activity:   . Days of Exercise per Week:   . Minutes of Exercise per Session:   Stress:   . Feeling of Stress :   Social Connections:   . Frequency of Communication with Friends and Family:   . Frequency of Social Gatherings with Friends and Family:   . Attends Religious Services:   . Active Member of Clubs or Organizations:   . Attends Archivist Meetings:   Marland Kitchen Marital Status:      Family History: The patient's family history includes Healthy in her father and mother.  ROS:   All other ROS reviewed and negative. Pertinent positives noted in the HPI.     EKGs/Labs/Other Studies Reviewed:   The following studies were personally reviewed by me today:  EKG:  EKG dated 07/09/2019.  The ekg ordered today demonstrates sinus tachycardia, heart rate 102, left atrial enlargement noted, no acute ST segment changes, no evidence of prior heart, and was personally reviewed by me.   Recent Labs: 07/09/2019: ALT 32; B Natriuretic Peptide 29.2; BUN 13; Creatinine, Ser 0.91; Hemoglobin 12.1; Platelets 497; Potassium 4.1; Sodium 140    Recent Lipid Panel No results found for: CHOL, TRIG, HDL, CHOLHDL, VLDL, LDLCALC, LDLDIRECT  Physical Exam:   VS:  BP 116/82   Pulse 86   Temp (!) 97.4 F (36.3 C)   Resp 20   Ht 5\' 6"  (1.676 m)   Wt 253 lb 9.6 oz (115 kg)   SpO2 98%   BMI 40.93 kg/m    Wt Readings from Last 3 Encounters:  07/18/19 253 lb 9.6 oz (115 kg)  07/01/19 285 lb (129.3 kg)  06/24/19 288 lb (130.6 kg)    General: Well nourished, well developed, in no acute distress Heart: Atraumatic, normal size  Eyes: PEERLA, EOMI  Neck: Supple, no JVD Endocrine: No thryomegaly Cardiac: Normal S1, S2; RRR; no murmurs, rubs, or gallops Lungs: Clear to auscultation bilaterally, no wheezing, rhonchi or rales  Abd: Soft, nontender, no hepatomegaly  Ext: No edema, pulses 2+ Musculoskeletal: No deformities, BUE and BLE strength normal and equal Skin: Warm and dry, no rashes   Neuro: Alert and oriented to person, place, time, and situation, CNII-XII grossly intact, no focal deficits  Psych: Normal mood and affect   ASSESSMENT:   Dana Smith is a 34 y.o. female who presents for the following: 1. Hypervolemia, unspecified hypervolemia type   2. SOB (shortness of breath)     PLAN:   1. Hypervolemia, unspecified hypervolemia type 2. SOB (shortness of breath) -She was admitted on 07/01/2019-07/03/2019 for volume overload after pregnancy.  This occurred roughly 6 days after her C-section on 06/25/2019.  She was noticeably hypertensive during that hospital stay.  The differential is preeclampsia that cause volume overload from accelerated hypertension versus a peripartum cardiomyopathy.  Her BNP was elevated 340 but has trended back down to normal on 07/09/2019.  She is lost nearly 30 pounds and appears to be doing well.  She has no stigmata of heart failure on examination today.  Her EKG simply demonstrates sinus tachycardia on 07/09/2019.  She has no lower extremity edema and no murmurs on examination.  For now I have  recommended no medical therapy.  We will check a TSH as 1 has not been checked recently.  She does need to proceed with her echocardiogram on 08/03/2019.  We will follow-up the results of that with her.  If her heart function is normal this is unlikely to be a peripartum cardiomyopathy.  I suspect this will just be preeclampsia.  She does have a desire for subsequent pregnancies and pending what her echocardiogram shows we will advise her on the risk of future issues.  In the interim, I have recommended continued weight loss that she has plans for this.  We also need to think about sleep apnea is a possibility in the future as well.  Her blood pressure is normal today and seems to be much improved.  Disposition: Return if symptoms worsen or fail to improve.  Medication Adjustments/Labs and Tests Ordered: Current medicines are reviewed at length with the patient today.  Concerns regarding medicines are outlined above.  Orders Placed This Encounter  Procedures  . TSH   No orders of the defined types were placed in this encounter.   Patient Instructions  Medication Instructions:  The current medical regimen is effective;  continue present plan and medications.  *If you need a refill on your cardiac medications before your next appointment, please call your pharmacy*  Lab: TSH today   Follow-Up: At [facility], you and your health needs are our priority.  As part of our continuing mission to provide you with exceptional heart care, we have created designated Provider Care Teams.  These Care Teams include your primary Cardiologist (physician) and Advanced Practice Providers (APPs -  Physician Assistants and Nurse Practitioners) who all work together to provide you with the care you need, when you need it.  We recommend signing up for the patient portal called "MyChart".  Sign up information is provided on this After Visit Summary.  MyChart is used to connect with patients for Virtual Visits  (Telemedicine).  Patients are able to view lab/test results, encounter notes, upcoming appointments, etc.  Non-urgent messages can be sent to your provider as well.  To learn more about what you can do with MyChart, go to [email or web address].    Your next appointment:   As needed  The format for your next appointment:   In Person  Provider:   Eleonore Chiquito, MD        Signed, Addison Naegeli. Audie Box, [place]  [address] [place], [place] [address] [phone]  07/18/2019 9:24 AM

## 2019-07-18 ENCOUNTER — Encounter: Payer: Self-pay | Admitting: Cardiovascular Disease

## 2019-07-18 ENCOUNTER — Ambulatory Visit (INDEPENDENT_AMBULATORY_CARE_PROVIDER_SITE_OTHER): Payer: No Typology Code available for payment source | Admitting: Cardiovascular Disease

## 2019-07-18 ENCOUNTER — Other Ambulatory Visit: Payer: Self-pay

## 2019-07-18 VITALS — BP 116/82 | HR 86 | Temp 97.4°F | Resp 20 | Ht 66.0 in | Wt 253.6 lb

## 2019-07-18 DIAGNOSIS — E8770 Fluid overload, unspecified: Secondary | ICD-10-CM

## 2019-07-18 DIAGNOSIS — R0602 Shortness of breath: Secondary | ICD-10-CM

## 2019-07-18 LAB — TSH: TSH: 0.571 u[IU]/mL (ref 0.450–4.500)

## 2019-07-18 NOTE — Patient Instructions (Addendum)
Medication Instructions:  The current medical regimen is effective;  continue present plan and medications.  *If you need a refill on your cardiac medications before your next appointment, please call your pharmacy*  Lab: TSH today   Follow-Up: At [facility], you and your health needs are our priority.  As part of our continuing mission to provide you with exceptional heart care, we have created designated Provider Care Teams.  These Care Teams include your primary Cardiologist (physician) and Advanced Practice Providers (APPs -  Physician Assistants and Nurse Practitioners) who all work together to provide you with the care you need, when you need it.  We recommend signing up for the patient portal called "MyChart".  Sign up information is provided on this After Visit Summary.  MyChart is used to connect with patients for Virtual Visits (Telemedicine).  Patients are able to view lab/test results, encounter notes, upcoming appointments, etc.  Non-urgent messages can be sent to your provider as well.   To learn more about what you can do with MyChart, go to [email or web address].    Your next appointment:   As needed  The format for your next appointment:   In Person  Provider:   Eleonore Chiquito, MD

## 2019-08-03 ENCOUNTER — Ambulatory Visit (HOSPITAL_COMMUNITY): Payer: No Typology Code available for payment source | Attending: Cardiology

## 2019-08-03 ENCOUNTER — Other Ambulatory Visit: Payer: Self-pay

## 2019-08-03 DIAGNOSIS — R0602 Shortness of breath: Secondary | ICD-10-CM | POA: Diagnosis not present

## 2019-08-03 DIAGNOSIS — O1495 Unspecified pre-eclampsia, complicating the puerperium: Secondary | ICD-10-CM | POA: Diagnosis present

## 2020-06-18 ENCOUNTER — Inpatient Hospital Stay (HOSPITAL_COMMUNITY)
Admission: AD | Admit: 2020-06-18 | Discharge: 2020-06-18 | Disposition: A | Discharge status: Home / Self Care | Payer: BC Managed Care – PPO | Attending: Obstetrics and Gynecology | Admitting: Obstetrics and Gynecology

## 2020-06-18 ENCOUNTER — Other Ambulatory Visit: Payer: Self-pay

## 2020-06-18 ENCOUNTER — Encounter (HOSPITAL_COMMUNITY): Payer: Self-pay | Admitting: Obstetrics and Gynecology

## 2020-06-18 ENCOUNTER — Inpatient Hospital Stay (HOSPITAL_COMMUNITY): Payer: BC Managed Care – PPO

## 2020-06-18 DIAGNOSIS — O039 Complete or unspecified spontaneous abortion without complication: Secondary | ICD-10-CM | POA: Insufficient documentation

## 2020-06-18 DIAGNOSIS — O26891 Other specified pregnancy related conditions, first trimester: Secondary | ICD-10-CM | POA: Diagnosis present

## 2020-06-18 DIAGNOSIS — N939 Abnormal uterine and vaginal bleeding, unspecified: Secondary | ICD-10-CM

## 2020-06-18 DIAGNOSIS — Z3A10 10 weeks gestation of pregnancy: Secondary | ICD-10-CM | POA: Insufficient documentation

## 2020-06-18 DIAGNOSIS — R109 Unspecified abdominal pain: Secondary | ICD-10-CM | POA: Insufficient documentation

## 2020-06-18 DIAGNOSIS — O09291 Supervision of pregnancy with other poor reproductive or obstetric history, first trimester: Secondary | ICD-10-CM | POA: Diagnosis not present

## 2020-06-18 LAB — CBC
HCT: 35.5 % — ABNORMAL LOW (ref 36.0–46.0)
Hemoglobin: 11.3 g/dL — ABNORMAL LOW (ref 12.0–15.0)
MCH: 26.7 pg (ref 26.0–34.0)
MCHC: 31.8 g/dL (ref 30.0–36.0)
MCV: 83.9 fL (ref 80.0–100.0)
Platelets: 326 10*3/uL (ref 150–400)
RBC: 4.23 MIL/uL (ref 3.87–5.11)
RDW: 14.1 % (ref 11.5–15.5)
WBC: 12 10*3/uL — ABNORMAL HIGH (ref 4.0–10.5)
nRBC: 0 % (ref 0.0–0.2)

## 2020-06-18 MED ORDER — IBUPROFEN 800 MG PO TABS
800.0000 mg | ORAL_TABLET | Freq: Once | ORAL | Status: AC
  Administered 2020-06-18: 800 mg via ORAL
  Filled 2020-06-18: qty 1

## 2020-06-18 NOTE — Discharge Instructions (Signed)
Miscarriage A miscarriage is the loss of pregnancy before the 20th week. Most miscarriages happen during the first 3 months of pregnancy. Sometimes, a miscarriage can happen before a woman knows that she is pregnant. Having a miscarriage can be an emotional experience. If you have had a miscarriage, talk with your health care provider about any questions you may have about the loss of your baby, the grieving process, and your plans for future pregnancy. What are the causes? Many times, the cause of a miscarriage is not known. What increases the risk? The following factors may make a pregnant woman more likely to have a miscarriage: Certain medical conditions  Conditions that affect the hormone balance in the body, such as thyroid disease or polycystic ovary syndrome.  Diabetes.  Autoimmune disorders.  Infections.  Bleeding disorders.  Obesity. Lifestyle factors  Using products with tobacco or nicotine in them or being exposed to tobacco smoke.  Having alcohol.  Having large amounts of caffeine.  Recreational drug use. Problems with reproductive organs or structures  Cervical insufficiency. This is when the lowest part of the uterus (cervix) opens and thins before pregnancy is at term.  Having a condition called Asherman syndrome. This syndrome causes scarring in the uterus or causes the uterus to be abnormal in structure.  Fibrous growths, called fibroids, in the uterus.  Congenital abnormalities. These problems are present at birth.  Infection of the cervix or uterus. Personal or medical history  Injury (trauma).  Having had a miscarriage before.  Being younger than age 79 or older than age 66.  Exposure to harmful substances in the environment. This may include radiation or heavy metals, such as lead.  Use of certain medicines. What are the signs or symptoms? Symptoms of this condition include:  Vaginal bleeding or spotting, with or without cramps or  pain.  Pain or cramping in the abdomen or lower back.  Fluid or tissue coming out of the vagina. How is this diagnosed? This condition may be diagnosed based on:  A physical exam.  Ultrasound.  Lab tests, such as blood tests, urine tests, or swabs for infection. How is this treated? Treatment for a miscarriage is sometimes not needed if all the pregnancy tissue that was in the uterus comes out on its own, and there are no other problems such as infection or heavy bleeding. In other cases, this condition may be treated with:  Dilation and curettage (D&C). In this procedure, the cervix is stretched open and any remaining pregnancy tissue is removed from the lining of the uterus (endometrium).  Medicines. These may include: ? Antibiotic medicine, to treat infection. ? Medicine to help any remaining pregnancy tissue come out of the body. ? Medicine to reduce (contract) the size of the uterus. These medicines may be given if there is a lot of bleeding. If you have Rh-negative blood, you may be given an injection of a medicine called Rho(D) immune globulin. This medicine helps prevent problems with future pregnancies. Follow these instructions at home: Medicines  Take over-the-counter and prescription medicines only as told by your health care provider.  If you were prescribed antibiotic medicine, take it as told by your health care provider. Do not stop taking the antibiotic even if you start to feel better. Activity  Rest as told by your health care provider. Ask your health care provider what activities are safe for you.  Have someone help with home and family responsibilities during this time. General instructions  Monitor how much tissue  or blood clot material comes out of the vagina.  Do not have sex, douche, or put anything, such as tampons, in your vagina until your health care provider says it is okay.  To help you and your partner with the grieving process, talk with your  health care provider or get counseling.  When you are ready, meet with your health care provider to discuss any important steps you should take for your health. Also, discuss steps you should take to have a healthy pregnancy in the future.  Keep all follow-up visits. This is important.   Where to find more information  The [employer] of Obstetricians and Gynecologists: acog.org  [place] Department of Health and [employer], systems of Women's Health: EverydayCosmetics.no Contact a health care provider if:  You have a fever or chills.  There is bad-smelling fluid coming from the vagina.  You have more bleeding instead of less.  Tissue or blood clots come out of your vagina. Get help right away if:  You have severe cramps or pain in your back or abdomen.  Heavy bleeding soaks through 2 large sanitary pads an hour for more than 2 hours.  You become light-headed or weak.  You faint.  You feel sad, and your sadness takes over your thoughts.  You think about hurting yourself. If you ever feel like you may hurt yourself or others, or have thoughts about taking your own life, get help right away. Go to your nearest emergency department or:  Call your local emergency services (911 in the [place]  Call a suicide crisis helpline, such as the [place] at (806)689-2099. This is open 24 hours a day in the [place]  Text the Crisis Text Line at 9345283261 (in the [place] Summary  Most miscarriages happen in the first 3 months of pregnancy. Sometimes miscarriage happens before a woman knows that she is pregnant.  Follow instructions from your health care provider about medicines and activity.  To help you and your partner with grieving, talk with your health care provider or get counseling.  Keep all follow-up visits. This information is not intended to replace advice given to you by your health care provider. Make sure you discuss any questions you  have with your health care provider. Document Revised: 08/10/2019 Document Reviewed: 08/10/2019 Elsevier Patient Education  [place].

## 2020-06-18 NOTE — MAU Provider Note (Signed)
History     CSN: [id number]  Arrival date and time: 06/18/20 1638   Event Date/Time   First Provider Initiated Contact with Patient 06/18/20 1710      Chief Complaint  Patient presents with  . Abdominal Pain  . Vaginal Bleeding   Dana Smith is a 35 y.o. G3P1011 at [redacted]w[redacted]d who receives care at [facility].  She presents today for Abdominal Pain and Vaginal Bleeding.  Patient walked directly from triage to room with heavy bleeding.   Patient has bleed through clothes, socks, and shoes when provider at bedside. Patient reports her bleeding increased today that started at work.  She states she is being followed, by her primary ob, for possible failed pregnancy as her recent US shows 6 wk pregnancy, but she is 10 weeks by LMP.  She reports intense cramping and passing of blood clots the size of a golf ball. Patient denies syncope, dizziness, headache, or lightheadedness.  Patient appears stable and vital signs normal.    OB History    Gravida  3   Para  1   Term  1   Preterm      AB  1   Living  1     SAB  0   IAB      Ectopic  1   Multiple  0   Live Births  1           Past Medical History:  Diagnosis Date  . Arrest of dilation, delivered, current hospitalization 06/25/2019  . Fibroid   . Gestational diabetes    metformin    Past Surgical History:  Procedure Laterality Date  . CESAREAN SECTION N/A 06/25/2019   Procedure: CESAREAN SECTION;  Surgeon: Sherlyn Hay, DO;  [facility]: [facility];  Service: Obstetrics;  Laterality: N/A;  . LAPAROSCOPY Left 08/11/2018   Procedure: LAPAROSCOPY DIAGNOSTIC, LEFT SALPINGECTOMY WITH REMOVAL OF ECTOPIC PREGNANCY;  Surgeon: Sherlyn Hay, DO;  Location: [facility];  Service: Gynecology;  Laterality: Left;  . WISDOM TOOTH EXTRACTION Bilateral     Family History  Problem Relation Age of Onset  . Healthy Mother   . Healthy Father     Social History   Tobacco Use  . Smoking status: Never  Smoker  . Smokeless tobacco: Never Used  Vaping Use  . Vaping Use: Never used  Substance Use Topics  . Alcohol use: Not Currently    Comment: Socially  . Drug use: Never    Allergies: No Known Allergies  Medications Prior to Admission  Medication Sig Dispense Refill Last Dose  . acetaminophen (TYLENOL) 500 MG tablet Take 1,000 mg by mouth every 6 (six) hours as needed for mild pain or headache.      . diphenhydrAMINE-zinc acetate (BENADRYL) cream Apply 1 application topically 3 (three) times daily as needed for itching.     Marland Kitchen HYDROcodone-homatropine (HYCODAN) 5-1.5 MG/5ML syrup Take 5 mLs by mouth at bedtime as needed for cough. 120 mL 0   . ibuprofen (ADVIL) 800 MG tablet Take 1 tablet (800 mg total) by mouth every 8 (eight) hours as needed. 45 tablet 1   . oxyCODONE (OXY IR/ROXICODONE) 5 MG immediate release tablet Take 1-2 tablets (5-10 mg total) by mouth every 6 (six) hours as needed for moderate pain. 30 tablet 0     Review of Systems  Genitourinary: Positive for vaginal bleeding.   Physical Exam   Blood pressure 102/62, pulse 98, temperature 98.3 F (36.8 C), temperature  source Oral, last menstrual period 04/09/2020, SpO2 100 %, unknown if currently breastfeeding.  Physical Exam Vitals reviewed. Exam conducted with a chaperone present.  Constitutional:      General: She is not in acute distress.    Appearance: She is well-developed. She is obese. She is not toxic-appearing.  HENT:     Head: Normocephalic and atraumatic.  Cardiovascular:     Rate and Rhythm: Normal rate.  Pulmonary:     Effort: Pulmonary effort is normal. No respiratory distress.  Abdominal:     Tenderness: There is abdominal tenderness (Cramping) in the right lower quadrant, suprapubic area and left lower quadrant.  Genitourinary:    Vagina: Bleeding present.     Comments: Speculum Exam: -Normal External Genitalia: Non tender, blood noted at introitus. -Vaginal Vault: Pink mucosa with good rugae.  Large amt blood noted.  ?POC removed ~ size of baseball.  -Cervix:Pink, no lesions, cysts, or polyps.  Appears slightly open. Active bleeding from os -Bimanual Exam:  Deferred  Skin:    General: Skin is warm and dry.  Neurological:     Mental Status: She is alert.     MAU Course  Procedures Results for orders placed or performed during the hospital encounter of 06/18/20 (from the past 24 hour(s))  CBC     Status: Abnormal   Collection Time: 06/18/20  7:26 PM  Result Value Ref Range   WBC 12.0 (H) 4.0 - 10.5 K/uL   RBC 4.23 3.87 - 5.11 MIL/uL   Hemoglobin 11.3 (L) 12.0 - 15.0 g/dL   HCT 35.5 (L) 36.0 - 46.0 %   MCV 83.9 80.0 - 100.0 fL   MCH 26.7 26.0 - 34.0 pg   MCHC 31.8 30.0 - 36.0 g/dL   RDW 14.1 11.5 - 15.5 %   Platelets 326 150 - 400 K/uL   nRBC 0.0 0.0 - 0.2 %    US OB LESS THAN 14 WEEKS WITH OB TRANSVAGINAL  Result Date: 06/18/2020 CLINICAL DATA:  Initial evaluation for acute vaginal bleeding, early pregnancy. EXAM: OBSTETRIC <14 WK [place] AND TRANSVAGINAL OB US TECHNIQUE: Both transabdominal and transvaginal ultrasound examinations were performed for complete evaluation of the gestation as well as the maternal uterus, adnexal regions, and pelvic cul-de-sac. Transvaginal technique was performed to assess early pregnancy. COMPARISON:  None available. FINDINGS: Intrauterine gestational sac: Single. Gestational sac is low lying within at the level of the lower uterine segment. Scattered internal low-level echoes and/or debris seen within the sac itself. Yolk sac:  Negative. Embryo:  Negative. Cardiac Activity: Negative. Heart Rate: N/A MSD: 16.1 mm   6 w   3 d Subchorionic hemorrhage:  None visualized. Maternal uterus/adnexae: 9 other ovary well seen. No adnexal mass or free fluid. IMPRESSION: 1. Single gestational sac, low lying at the level of the lower uterine segment. Scattered internal low-level echoes without yolk sac or embryo. Findings are suspicious but not yet definitive for  failed pregnancy, and most concerning for a SAB in progress. Recommend follow-up US in 10-14 days for definitive diagnosis. This recommendation follows SRU consensus guidelines: Diagnostic Criteria for Nonviable Pregnancy Early in the First Trimester. [place] Med 2013; 657:8469-62. 2. No other acute maternal uterine or adnexal abnormality identified. Electronically Signed   By: Jeannine Boga M.D.   On: 06/18/2020 18:39    MDM Pelvic Exam Pain Medication Ultrasound  Assessment and Plan  28 year G3P1011  Spontaneous Abortion  -Pelvic exam performed. -Patient informed that vaginal contents suspicious for POC. -Will  send findings to pathology for further evaluation. Order placed. -Will send for [place] to assess uterine contents.  -Patient offered pain medication.  Will give ibuprofen 800mg  now.  -Will await results.   Maryann Conners 06/18/2020, 5:10 PM   Reassessment (6:56 PM) -[place] results as above -CBC ordered.  Reassessment (8:16 PM) -Provider to bedside to discuss results. -Informed that gestational sac remains present in lower uterine segment per ultrasound. -Informed that provider will still send vaginal contents for pathology evaluation. -CBC pending, but if >9 will discharge to home. -Patient agreeable with plan. -Bleeding precautions discussed. -Encouraged to treat cramping appropriately with tylenol. -Patient to follow up with primary ob as scheduled-Tuesday. -Encouraged to call or return to MAU if symptoms worsen or with the onset of new symptoms. -Discharged to home in stable condition.  Maryann Conners MSN, CNM Advanced Practice Provider, [facility]

## 2020-06-18 NOTE — MAU Note (Signed)
...  Dana Smith is a 35 y.o. at Unknown here in MAU reporting: vaginal bleeding that began this morning and lower abdominal cramping that began at 1600 today. Passing large clots.

## 2020-06-20 LAB — SURGICAL PATHOLOGY

## 2020-07-28 ENCOUNTER — Other Ambulatory Visit: Payer: Self-pay

## 2020-07-28 ENCOUNTER — Encounter (HOSPITAL_COMMUNITY): Payer: Self-pay | Admitting: Obstetrics and Gynecology

## 2020-07-28 ENCOUNTER — Other Ambulatory Visit: Payer: Self-pay | Admitting: Obstetrics and Gynecology

## 2020-07-28 NOTE — Progress Notes (Signed)
Spoke with pt for pre-op call. Pt states the only time she had HTN was with pre-eclampsia with her pregnancy in 2021. States she was seen by a cardiologist one time after pregnancy for pulmonary edema postpartum. She states she's not had any problems since.   Pt's surgery is scheduled as ambulatory so no Covid test is required prior to surgery. Pt denies any Covid symptoms.

## 2020-07-29 ENCOUNTER — Encounter (HOSPITAL_COMMUNITY): Payer: Self-pay | Admitting: Obstetrics and Gynecology

## 2020-07-29 ENCOUNTER — Ambulatory Visit (HOSPITAL_COMMUNITY): Payer: BC Managed Care – PPO | Admitting: Anesthesiology

## 2020-07-29 ENCOUNTER — Encounter (HOSPITAL_COMMUNITY)
Admission: RE | Disposition: A | Discharge status: Home / Self Care | Payer: Self-pay | Source: Home / Self Care | Attending: Obstetrics and Gynecology

## 2020-07-29 ENCOUNTER — Ambulatory Visit (HOSPITAL_COMMUNITY)
Admission: RE | Admit: 2020-07-29 | Discharge: 2020-07-29 | Disposition: A | Discharge status: Home / Self Care | Payer: BC Managed Care – PPO | Attending: Obstetrics and Gynecology | Admitting: Obstetrics and Gynecology

## 2020-07-29 DIAGNOSIS — Z8632 Personal history of gestational diabetes: Secondary | ICD-10-CM | POA: Insufficient documentation

## 2020-07-29 DIAGNOSIS — O034 Incomplete spontaneous abortion without complication: Secondary | ICD-10-CM | POA: Diagnosis not present

## 2020-07-29 DIAGNOSIS — Z8759 Personal history of other complications of pregnancy, childbirth and the puerperium: Secondary | ICD-10-CM | POA: Insufficient documentation

## 2020-07-29 DIAGNOSIS — Z515 Encounter for palliative care: Secondary | ICD-10-CM

## 2020-07-29 DIAGNOSIS — Z98891 History of uterine scar from previous surgery: Secondary | ICD-10-CM | POA: Insufficient documentation

## 2020-07-29 DIAGNOSIS — Z90721 Acquired absence of ovaries, unilateral: Secondary | ICD-10-CM | POA: Insufficient documentation

## 2020-07-29 HISTORY — DX: Gastro-esophageal reflux disease without esophagitis: K21.9

## 2020-07-29 HISTORY — PX: DILATION AND EVACUATION: SHX1459

## 2020-07-29 HISTORY — DX: Unspecified pre-eclampsia, unspecified trimester: O14.90

## 2020-07-29 HISTORY — DX: Anxiety disorder, unspecified: F41.9

## 2020-07-29 HISTORY — DX: Anemia, unspecified: D64.9

## 2020-07-29 HISTORY — DX: Pneumonia, unspecified organism: J18.9

## 2020-07-29 LAB — BASIC METABOLIC PANEL
Anion gap: 9 (ref 5–15)
BUN: 9 mg/dL (ref 6–20)
CO2: 21 mmol/L — ABNORMAL LOW (ref 22–32)
Calcium: 8.7 mg/dL — ABNORMAL LOW (ref 8.9–10.3)
Chloride: 104 mmol/L (ref 98–111)
Creatinine, Ser: 0.62 mg/dL (ref 0.44–1.00)
GFR, Estimated: >60 mL/min (ref >60)
Glucose, Bld: 104 mg/dL — ABNORMAL HIGH (ref 70–99)
Potassium: 3.9 mmol/L (ref 3.5–5.1)
Sodium: 134 mmol/L — ABNORMAL LOW (ref 135–145)

## 2020-07-29 LAB — CBC
HCT: 34.4 % — ABNORMAL LOW (ref 36.0–46.0)
Hemoglobin: 10 g/dL — ABNORMAL LOW (ref 12.0–15.0)
MCH: 24.2 pg — ABNORMAL LOW (ref 26.0–34.0)
MCHC: 29.1 g/dL — ABNORMAL LOW (ref 30.0–36.0)
MCV: 83.1 fL (ref 80.0–100.0)
Platelets: 439 10*3/uL — ABNORMAL HIGH (ref 150–400)
RBC: 4.14 MIL/uL (ref 3.87–5.11)
RDW: 14.3 % (ref 11.5–15.5)
WBC: 8.5 10*3/uL (ref 4.0–10.5)
nRBC: 0 % (ref 0.0–0.2)

## 2020-07-29 SURGERY — DILATION AND EVACUATION, UTERUS
Anesthesia: Monitor Anesthesia Care | Site: Uterus

## 2020-07-29 MED ORDER — ONDANSETRON HCL 4 MG/2ML IJ SOLN
INTRAMUSCULAR | Status: AC
  Filled 2020-07-29: qty 2

## 2020-07-29 MED ORDER — OXYCODONE HCL 5 MG PO TABS: 5.0000 mg | ORAL_TABLET | Freq: Once | ORAL | Status: DC | PRN

## 2020-07-29 MED ORDER — CHLORHEXIDINE GLUCONATE 0.12 % MT SOLN
15.0000 mL | Freq: Once | OROMUCOSAL | Status: AC
  Administered 2020-07-29: 15 mL via OROMUCOSAL
  Filled 2020-07-29: qty 15

## 2020-07-29 MED ORDER — ORAL CARE MOUTH RINSE: 15.0000 mL | Freq: Once | OROMUCOSAL | Status: AC

## 2020-07-29 MED ORDER — POVIDONE-IODINE 10 % EX SWAB: 2.0000 "application " | Freq: Once | CUTANEOUS | Status: DC

## 2020-07-29 MED ORDER — ACETAMINOPHEN 325 MG PO TABS: 325.0000 mg | ORAL_TABLET | ORAL | Status: DC | PRN

## 2020-07-29 MED ORDER — 0.9 % SODIUM CHLORIDE (POUR BTL) OPTIME
TOPICAL | Status: DC | PRN
  Administered 2020-07-29: 1000 mL

## 2020-07-29 MED ORDER — DOXYCYCLINE HYCLATE 50 MG PO CAPS: 100.0000 mg | ORAL_CAPSULE | Freq: Two times a day (BID) | ORAL | 0 refills | Status: DC

## 2020-07-29 MED ORDER — PROPOFOL 500 MG/50ML IV EMUL
INTRAVENOUS | Status: DC | PRN
  Administered 2020-07-29: 100 ug/kg/min via INTRAVENOUS

## 2020-07-29 MED ORDER — FENTANYL CITRATE (PF) 100 MCG/2ML IJ SOLN
INTRAMUSCULAR | Status: AC
  Filled 2020-07-29: qty 2

## 2020-07-29 MED ORDER — IBUPROFEN 600 MG PO TABS: 600.0000 mg | ORAL_TABLET | Freq: Four times a day (QID) | ORAL | 0 refills | Status: DC | PRN

## 2020-07-29 MED ORDER — PROMETHAZINE HCL 25 MG/ML IJ SOLN: 6.2500 mg | INTRAMUSCULAR | Status: DC | PRN

## 2020-07-29 MED ORDER — FENTANYL CITRATE (PF) 250 MCG/5ML IJ SOLN
INTRAMUSCULAR | Status: DC | PRN
  Administered 2020-07-29: 50 ug via INTRAVENOUS

## 2020-07-29 MED ORDER — MIDAZOLAM HCL 2 MG/2ML IJ SOLN
INTRAMUSCULAR | Status: DC | PRN
  Administered 2020-07-29: 2 mg via INTRAVENOUS

## 2020-07-29 MED ORDER — PROPOFOL 10 MG/ML IV BOLUS
INTRAVENOUS | Status: AC
  Filled 2020-07-29: qty 20

## 2020-07-29 MED ORDER — MIDAZOLAM HCL 2 MG/2ML IJ SOLN
INTRAMUSCULAR | Status: AC
  Filled 2020-07-29: qty 2

## 2020-07-29 MED ORDER — FENTANYL CITRATE (PF) 100 MCG/2ML IJ SOLN
25.0000 ug | INTRAMUSCULAR | Status: DC | PRN
  Administered 2020-07-29: 25 ug via INTRAVENOUS

## 2020-07-29 MED ORDER — AMISULPRIDE (ANTIEMETIC) 5 MG/2ML IV SOLN: 10.0000 mg | Freq: Once | INTRAVENOUS | Status: DC | PRN

## 2020-07-29 MED ORDER — OXYCODONE-ACETAMINOPHEN 2.5-325 MG PO TABS: 1.0000 | ORAL_TABLET | ORAL | 0 refills | Status: DC | PRN

## 2020-07-29 MED ORDER — ACETAMINOPHEN 160 MG/5ML PO SOLN: 325.0000 mg | ORAL | Status: DC | PRN

## 2020-07-29 MED ORDER — ACETAMINOPHEN 10 MG/ML IV SOLN: 1000.0000 mg | Freq: Once | INTRAVENOUS | Status: DC | PRN

## 2020-07-29 MED ORDER — SILVER NITRATE-POT NITRATE 75-25 % EX MISC
CUTANEOUS | Status: DC | PRN
  Administered 2020-07-29: 1

## 2020-07-29 MED ORDER — FENTANYL CITRATE (PF) 250 MCG/5ML IJ SOLN
INTRAMUSCULAR | Status: AC
  Filled 2020-07-29: qty 5

## 2020-07-29 MED ORDER — DEXAMETHASONE SODIUM PHOSPHATE 10 MG/ML IJ SOLN
INTRAMUSCULAR | Status: AC
  Filled 2020-07-29: qty 1

## 2020-07-29 MED ORDER — LIDOCAINE HCL 2 % IJ SOLN
INTRAMUSCULAR | Status: DC | PRN
  Administered 2020-07-29: 20 mL

## 2020-07-29 MED ORDER — LACTATED RINGERS IV SOLN: INTRAVENOUS | Status: DC

## 2020-07-29 MED ORDER — KETOROLAC TROMETHAMINE 30 MG/ML IJ SOLN
INTRAMUSCULAR | Status: DC | PRN
  Administered 2020-07-29: 30 mg via INTRAVENOUS

## 2020-07-29 MED ORDER — ONDANSETRON HCL 4 MG/2ML IJ SOLN
INTRAMUSCULAR | Status: DC | PRN
  Administered 2020-07-29: 4 mg via INTRAVENOUS

## 2020-07-29 MED ORDER — OXYCODONE HCL 5 MG/5ML PO SOLN: 5.0000 mg | Freq: Once | ORAL | Status: DC | PRN

## 2020-07-29 MED ORDER — LIDOCAINE 2% (20 MG/ML) 5 ML SYRINGE
INTRAMUSCULAR | Status: DC | PRN
  Administered 2020-07-29: 40 mg via INTRAVENOUS

## 2020-07-29 MED ORDER — LIDOCAINE HCL 2 % IJ SOLN
INTRAMUSCULAR | Status: AC
  Filled 2020-07-29: qty 20

## 2020-07-29 MED ORDER — PROPOFOL 10 MG/ML IV BOLUS
INTRAVENOUS | Status: DC | PRN
  Administered 2020-07-29: 20 mg via INTRAVENOUS

## 2020-07-29 SURGICAL SUPPLY — 23 items
CATH ROBINSON RED A/P 16FR (CATHETERS) ×3 IMPLANT
DECANTER SPIKE VIAL GLASS SM (MISCELLANEOUS) ×3 IMPLANT
DILATOR CANAL MILEX (MISCELLANEOUS) IMPLANT
FILTER UTR ASPR ASSEMBLY (MISCELLANEOUS) ×3 IMPLANT
GLOVE BIO SURGEON STRL SZ 6.5 (GLOVE) ×2 IMPLANT
GLOVE BIO SURGEONS STRL SZ 6.5 (GLOVE) ×1
GLOVE SURG UNDER POLY LF SZ7 (GLOVE) ×6 IMPLANT
GOWN STRL REUS W/ TWL LRG LVL3 (GOWN DISPOSABLE) ×2 IMPLANT
GOWN STRL REUS W/TWL LRG LVL3 (GOWN DISPOSABLE) ×4
HOSE CONNECTING 18IN BERKELEY (TUBING) ×3 IMPLANT
KIT BERKELEY 1ST TRI 3/8 NO TR (MISCELLANEOUS) ×3 IMPLANT
KIT BERKELEY 1ST TRIMESTER 3/8 (MISCELLANEOUS) ×3 IMPLANT
NS IRRIG 1000ML POUR BTL (IV SOLUTION) ×3 IMPLANT
PACK VAGINAL MINOR WOMEN LF (CUSTOM PROCEDURE TRAY) ×3 IMPLANT
PAD OB MATERNITY 4.3X12.25 (PERSONAL CARE ITEMS) ×3 IMPLANT
SET BERKELEY SUCTION TUBING (SUCTIONS) ×3 IMPLANT
SYR 20ML LL LF (SYRINGE) ×3 IMPLANT
TOWEL GREEN STERILE FF (TOWEL DISPOSABLE) ×6 IMPLANT
UNDERPAD 30X36 HEAVY ABSORB (UNDERPADS AND DIAPERS) ×3 IMPLANT
VACURETTE 10 RIGID CVD (CANNULA) IMPLANT
VACURETTE 7MM CVD STRL WRAP (CANNULA) ×3 IMPLANT
VACURETTE 8 RIGID CVD (CANNULA) IMPLANT
VACURETTE 9 RIGID CVD (CANNULA) IMPLANT

## 2020-07-29 NOTE — H&P (Signed)
Dana Smith is a 35 y.o. female presenting for D&E after having SAB.  06/18/20.  She continued to bleed and [place] 6/3 showed retained POC.  She tried cytotec to pass it but had no change and desires D&E History OB History    Gravida  3   Para  1   Term  1   Preterm      AB  1   Living  1     SAB  0   IAB      Ectopic  1   Multiple  0   Live Births  1          Past Medical History:  Diagnosis Date  . Anemia    low iron  . Anxiety   . Arrest of dilation, delivered, current hospitalization 06/25/2019  . Fibroid   . GERD (gastroesophageal reflux disease)   . Gestational diabetes    metformin   (2021)  . Pneumonia    as a baby  . Preeclampsia    pulmonary edema post partum (2021, May)   Past Surgical History:  Procedure Laterality Date  . CESAREAN SECTION N/A 06/25/2019   Procedure: CESAREAN SECTION;  Surgeon: Sherlyn Hay, DO;  Location: [facility];  Service: Obstetrics;  Laterality: N/A;  . LAPAROSCOPY Left 08/11/2018   Procedure: LAPAROSCOPY DIAGNOSTIC, LEFT SALPINGECTOMY WITH REMOVAL OF ECTOPIC PREGNANCY;  Surgeon: Sherlyn Hay, DO;  Location: [place];  Service: Gynecology;  Laterality: Left;  . WISDOM TOOTH EXTRACTION Bilateral    Family History: family history includes Healthy in her father and mother. Social History:  reports that she has never smoked. She has never used smokeless tobacco. She reports previous alcohol use. She reports that she does not use drugs.  Review of Systems - Negative except  above  Physical Examination: General appearance - alert, well appearing, and in no distress Heart - normal rate and regular rhythm Abdomen - soft, nontender, nondistended, no masses or organomegaly Pelvic - normal external genitalia, vulva, vagina, cervix, uterus and adnexa, cx closed with minimal bleeding Extremities - Homan's sign negative bilaterally    Last menstrual period 04/09/2020, unknown if currently  breastfeeding. Exam Physical Exam  Prenatal labs: ABO, Rh:  o positive      Assessment/Plan: INCOMPLETE SAB PT DESIRES D&E TO REMOVE RETAINED POC ]R&B REVIEWED WILL DO WITH [place].     Therasa Lorenzi A Andriana Casa 07/29/2020, 12:00 PM

## 2020-07-29 NOTE — Transfer of Care (Signed)
Immediate Anesthesia Transfer of Care Note  Patient: Dana Smith  Procedure(s) Performed: DILATATION AND EVACUATION (N/A Uterus)  Patient Location: PACU  Anesthesia Type:MAC  Level of Consciousness: awake, alert  and oriented  Airway & Oxygen Therapy: Patient Spontanous Breathing  Post-op Assessment: Report given to RN and Post -op Vital signs reviewed and stable  Post vital signs: Reviewed and stable  Last Vitals:  Vitals Value Taken Time  BP    Temp    Pulse    Resp 23 07/29/20 1531  SpO2    Vitals shown include unvalidated device data.  Last Pain:  Vitals:   07/29/20 1231  TempSrc:   PainSc: 0-No pain      Patients Stated Pain Goal: 6 (25/75/05 1833)  Complications: No complications documented.

## 2020-07-29 NOTE — Discharge Instructions (Signed)
Miscarriage A miscarriage is the loss of a pregnancy before the 20th week of pregnancy. Sometimes, a pregnancy ends before a woman knows that she is pregnant. If you lose a pregnancy, talk with your doctor about:  Questions you have about the loss of your baby.  How to work through your grief.  Plans for future pregnancy. What are the causes? Many times, the cause of this condition is not known. What increases the risk? These things may make a pregnant woman more likely to lose a pregnancy: Certain health conditions  Conditions that affect hormones, such as: ? Thyroid disease. ? Polycystic ovary syndrome.  Diabetes.  A disease that causes the body's disease-fighting system to attack itself by mistake.  Infections.  Bleeding problems.  Being very overweight. Lifestyle factors  Using products that have tobacco or nicotine in them.  Being around tobacco smoke.  Having alcohol.  Having a lot of caffeine.  Using drugs. Problems with reproductive organs or parts  Having a cervix that opens and thins before your due date. The cervix is the lowest part of your womb.  Having Asherman syndrome, which leads to: ? Scars in the womb. ? The womb being abnormal in shape.  Growths (fibroids) in the womb.  Problems in the body that are present at birth.  Infection of the cervix or womb. Personal or health history  Injury.  Having lost a pregnancy before.  Being younger than age 18 or older than age 35.  Being around a harmful substance, such as radiation.  Having lead or other heavy metals in: ? Things you eat or drink. ? The air around you.  Using certain medicines. What are the signs or symptoms?  Blood or spots of blood coming from the vagina. You may also have cramps or pain.  Pain or cramps in the belly or low back.  Fluid or tissue coming out of the vagina. How is this treated? Sometimes, treatment is not needed. If you need treatment, you may be  treated with:  A procedure to open the cervix more and take tissue out of the womb.  Medicines. You may get a shot of medicine called Rho(D) immune globulin. Follow these instructions at home: Medicines  Take over-the-counter and prescription medicines only as told by your doctor.  If you were prescribed antibiotic medicine, take it as told by your doctor. Do not stop taking it even if you start to feel better. Activity  Rest as told by your doctor. Ask your doctor what activities are safe for you.  Have someone help you at home during this time. General instructions  Watch how much tissue comes out of the vagina.  Watch the size of any blood clots that come out of the vagina.  Do not have sex or douche until your doctor says it is okay.  Do not put things, such as tampons, in your vagina until your doctor says it is okay.  To help you and your partner with grieving: ? Talk with your doctor. ? See a counselor.  When you are ready, talk with your doctor about: ? Things to do for your health. ? How you can be healthy if you get pregnant again.  Keep all follow-up visits.   Where to find more information  The American College of Obstetricians and Gynecologists: acog.org  U.S[place] Department of Health and Human Services Office of Women's Health: hrsa.gov/office-womens-health Contact a doctor if:  You have a fever or chills.  There is bad-smelling fluid coming   from the vagina.  You have more bleeding.  Tissue or clots of blood come out of your vagina. Get help right away if:  You have very bad cramps or pain in your back or belly.  You soak more than 2 large pads in an hour for more than 2 hours.  You get light-headed or weak.  You faint.  You feel sad, and you have sad thoughts a lot of the time.  You think about hurting yourself. Get help right awayif you feel like you may hurt yourself or others, or have thoughts about taking your own life. Go to your nearest  emergency room or:  Call your local emergency services (911 in the [place]  Call the National Suicide Prevention Lifeline at 1-800-273-8255. This is open 24 hours a day.  Text the Crisis Text Line at 741741. Summary  A miscarriage is the loss of a pregnancy before the 20th week of pregnancy. Sometimes, a pregnancy ends before a woman knows that she is pregnant.  Follow instructions from your doctor about medicines and activity.  To help you and your partner with grieving, talk with your doctor or a counselor.  Keep all follow-up visits. This information is not intended to replace advice given to you by your health care provider. Make sure you discuss any questions you have with your health care provider. Document Revised: 08/10/2019 Document Reviewed: 08/10/2019 Elsevier Patient Education  2021 Elsevier Inc.  

## 2020-07-29 NOTE — Anesthesia Preprocedure Evaluation (Addendum)
Anesthesia Evaluation  Patient identified by MRN, date of birth, ID band Patient awake    Reviewed: Allergy & Precautions, NPO status , Patient's Chart, lab work & pertinent test results  Airway Mallampati: II  TM Distance: >3 FB Neck ROM: Full    Dental  (+) Teeth Intact, Dental Advisory Given   Pulmonary neg pulmonary ROS,    breath sounds clear to auscultation       Cardiovascular hypertension,  Rhythm:Regular Rate:Normal     Neuro/Psych Anxiety negative neurological ROS     GI/Hepatic Neg liver ROS, GERD  ,  Endo/Other  diabetes  Renal/GU negative Renal ROS     Musculoskeletal negative musculoskeletal ROS (+)   Abdominal Normal abdominal exam  (+)   Peds  Hematology negative hematology ROS (+)   Anesthesia Other Findings   Reproductive/Obstetrics                            Anesthesia Physical Anesthesia Plan  ASA: II  Anesthesia Plan: MAC   Post-op Pain Management:    Induction: Intravenous  PONV Risk Score and Plan: 3 and Ondansetron, Dexamethasone, Propofol infusion and Midazolam  Airway Management Planned: Natural Airway and Simple Face Mask  Additional Equipment: None  Intra-op Plan:   Post-operative Plan:   Informed Consent: I have reviewed the patients History and Physical, chart, labs and discussed the procedure including the risks, benefits and alternatives for the proposed anesthesia with the patient or authorized representative who has indicated his/her understanding and acceptance.       Plan Discussed with: CRNA  Anesthesia Plan Comments: (Lab Results      Component                Value               Date                      WBC                      8.5                 07/29/2020                HGB                      10.0 (L)            07/29/2020                HCT                      34.4 (L)            07/29/2020                MCV                       83.1                07/29/2020                PLT                      439 (H)             07/29/2020           )  Anesthesia Quick Evaluation  

## 2020-07-30 ENCOUNTER — Encounter (HOSPITAL_COMMUNITY): Payer: Self-pay | Admitting: Obstetrics and Gynecology

## 2020-07-30 LAB — SURGICAL PATHOLOGY

## 2020-07-30 NOTE — Anesthesia Postprocedure Evaluation (Signed)
Anesthesia Post Note  Patient: Dana Smith  Procedure(s) Performed: DILATATION AND EVACUATION (N/A Uterus)     Patient location during evaluation: PACU Anesthesia Type: MAC Level of consciousness: awake and alert Pain management: pain level controlled Vital Signs Assessment: post-procedure vital signs reviewed and stable Respiratory status: spontaneous breathing, nonlabored ventilation, respiratory function stable and patient connected to nasal cannula oxygen Cardiovascular status: stable and blood pressure returned to baseline Postop Assessment: no apparent nausea or vomiting Anesthetic complications: no   No complications documented.          Effie Berkshire

## 2020-08-04 NOTE — Op Note (Signed)
Pre op DX :  Incomplete abortion Postoperative Diagnosis: same Procedure:  dilation and evacuation Anesthesia:  MAC and local Surgeon:  Dr. Charlesetta Garibaldi Asst : none Complications : none Procedure in detail: The patient was taken to the operating room where she was given MAC anesthesia. She was placed in dorsal lithotomy position and prepped and draped in normal sterile fashion. In and out catheter was used to drain the bladder. This was examined and noted to have a 8 week size uterus with no adnexal masses. A bivalve was placed into the vagina. A tenaculum was placed on the cervix. The cervix was infiltrated with 20 cc 1% lidocaine paracervical block. The cervix then dilated with dilators up to 21.  A size 7 suction curettage was placed into the uterine cavity. A scant amount of products of conception was seen. The suction curettage was removed when a gritty texture was noted. A sharp curette was done along all walls  of the uterus. The suction curet was placed back into the uterine cavity. No further products of conception were obtained. Sponge lap and needle counts were correct. Patient back in stable condition. The patient understood to be the risks to be, but not  limited to,  Bleeding,  Infection,  damage to internal organs by perforation of the uterus and Asherman syndrome (scarring in the uterus) leading to infertility.   I

## 2020-10-22 LAB — OB RESULTS CONSOLE GC/CHLAMYDIA
Chlamydia: NEGATIVE
Gonorrhea: NEGATIVE

## 2020-10-22 LAB — OB RESULTS CONSOLE RUBELLA ANTIBODY, IGM: Rubella: IMMUNE

## 2020-10-22 LAB — OB RESULTS CONSOLE RPR: RPR: NONREACTIVE

## 2020-10-22 LAB — OB RESULTS CONSOLE HEPATITIS B SURFACE ANTIGEN: Hepatitis B Surface Ag: NEGATIVE

## 2020-10-22 LAB — HEPATITIS C ANTIBODY: HCV Ab: NEGATIVE

## 2020-10-22 LAB — OB RESULTS CONSOLE ABO/RH: RH Type: POSITIVE

## 2020-10-22 LAB — OB RESULTS CONSOLE ANTIBODY SCREEN: Antibody Screen: NEGATIVE

## 2020-10-22 LAB — OB RESULTS CONSOLE HIV ANTIBODY (ROUTINE TESTING): HIV: NONREACTIVE

## 2021-04-14 ENCOUNTER — Other Ambulatory Visit: Payer: Self-pay | Admitting: Obstetrics and Gynecology

## 2021-05-12 ENCOUNTER — Encounter (HOSPITAL_COMMUNITY): Payer: Self-pay

## 2021-05-12 ENCOUNTER — Encounter (HOSPITAL_COMMUNITY): Payer: Self-pay | Admitting: *Deleted

## 2021-05-12 NOTE — Patient Instructions (Signed)
Dana Smith ? 05/12/2021 ? ? Your procedure is scheduled on:  05/25/2021 ? Arrive at 0800 at [place] on [address] at [facility]  and [address]. You are invited to use the FREE valet parking or use the Visitor's parking deck. ? Pick up the phone at the desk and dial [phone]. ? Call this number if you have problems the morning of surgery: [phone] ? Remember: ? ? Do not eat food:(After Midnight) Desp?s de medianoche. ? Do not drink clear liquids: (After Midnight) Desp?s de medianoche. ? Take these medicines the morning of surgery with A SIP OF WATER:  none ? ? Do not wear jewelry, make-up or nail polish. ? Do not wear lotions, powders, or perfumes. Do not wear deodorant. ? Do not shave 48 hours prior to surgery. ? Do not bring valuables to the hospital.  [facility] is not  ? responsible for any belongings or valuables brought to the hospital. ? Contacts, dentures or bridgework may not be worn into surgery. ? Leave suitcase in the car. After surgery it may be brought to your room. ? For patients admitted to the hospital, checkout time is 11:00 AM the day of  ?            discharge. ? ?   ? Please read over the following fact sheets that you were given:  ?   Preparing for Surgery ? ? ?

## 2021-05-22 ENCOUNTER — Encounter (HOSPITAL_COMMUNITY)
Admission: RE | Admit: 2021-05-22 | Discharge: 2021-05-22 | Disposition: A | Discharge status: Home / Self Care | Payer: BC Managed Care – PPO | Source: Ambulatory Visit | Attending: Obstetrics and Gynecology | Admitting: Obstetrics and Gynecology

## 2021-05-22 DIAGNOSIS — Z01812 Encounter for preprocedural laboratory examination: Secondary | ICD-10-CM | POA: Insufficient documentation

## 2021-05-22 LAB — CBC
HCT: 33.7 % — ABNORMAL LOW (ref 36.0–46.0)
Hemoglobin: 10.5 g/dL — ABNORMAL LOW (ref 12.0–15.0)
MCH: 26.5 pg (ref 26.0–34.0)
MCHC: 31.2 g/dL (ref 30.0–36.0)
MCV: 85.1 fL (ref 80.0–100.0)
Platelets: 316 10*3/uL (ref 150–400)
RBC: 3.96 MIL/uL (ref 3.87–5.11)
RDW: 16.4 % — ABNORMAL HIGH (ref 11.5–15.5)
WBC: 9.6 10*3/uL (ref 4.0–10.5)
nRBC: 0 % (ref 0.0–0.2)

## 2021-05-22 LAB — TYPE AND SCREEN
ABO/RH(D): O POS
Antibody Screen: NEGATIVE

## 2021-05-23 LAB — RPR: RPR Ser Ql: NONREACTIVE

## 2021-05-25 ENCOUNTER — Encounter (HOSPITAL_COMMUNITY): Payer: Self-pay | Admitting: Obstetrics and Gynecology

## 2021-05-25 ENCOUNTER — Encounter (HOSPITAL_COMMUNITY)
Admission: RE | Disposition: A | Discharge status: Home / Self Care | Payer: Self-pay | Source: Home / Self Care | Attending: Obstetrics and Gynecology

## 2021-05-25 ENCOUNTER — Inpatient Hospital Stay (HOSPITAL_COMMUNITY): Payer: BC Managed Care – PPO | Admitting: Anesthesiology

## 2021-05-25 ENCOUNTER — Other Ambulatory Visit: Payer: Self-pay

## 2021-05-25 ENCOUNTER — Inpatient Hospital Stay (HOSPITAL_COMMUNITY)
Admission: RE | Admit: 2021-05-25 | Discharge: 2021-05-27 | DRG: 788 | Disposition: A | Discharge status: Home / Self Care | Payer: BC Managed Care – PPO | Attending: Obstetrics and Gynecology | Admitting: Obstetrics and Gynecology

## 2021-05-25 DIAGNOSIS — Z3A39 39 weeks gestation of pregnancy: Secondary | ICD-10-CM | POA: Diagnosis not present

## 2021-05-25 DIAGNOSIS — O34219 Maternal care for unspecified type scar from previous cesarean delivery: Principal | ICD-10-CM | POA: Diagnosis present

## 2021-05-25 DIAGNOSIS — O99214 Obesity complicating childbirth: Secondary | ICD-10-CM | POA: Diagnosis present

## 2021-05-25 DIAGNOSIS — O24425 Gestational diabetes mellitus in childbirth, controlled by oral hypoglycemic drugs: Secondary | ICD-10-CM | POA: Diagnosis present

## 2021-05-25 DIAGNOSIS — Z9889 Other specified postprocedural states: Principal | ICD-10-CM

## 2021-05-25 DIAGNOSIS — Z98891 History of uterine scar from previous surgery: Secondary | ICD-10-CM

## 2021-05-25 LAB — GLUCOSE, CAPILLARY
Glucose-Capillary: 106 mg/dL — ABNORMAL HIGH (ref 70–99)
Glucose-Capillary: 86 mg/dL (ref 70–99)

## 2021-05-25 SURGERY — Surgical Case
Anesthesia: Spinal

## 2021-05-25 MED ORDER — NALOXONE HCL 0.4 MG/ML IJ SOLN: 0.4000 mg | INTRAMUSCULAR | Status: DC | PRN

## 2021-05-25 MED ORDER — BUPIVACAINE IN DEXTROSE 0.75-8.25 % IT SOLN
INTRATHECAL | Status: DC | PRN
  Administered 2021-05-25: 1.6 mL via INTRATHECAL

## 2021-05-25 MED ORDER — NALOXONE HCL 4 MG/10ML IJ SOLN
1.0000 ug/kg/h | INTRAVENOUS | Status: DC | PRN
  Filled 2021-05-25: qty 5

## 2021-05-25 MED ORDER — DIPHENHYDRAMINE HCL 25 MG PO CAPS: 25.0000 mg | ORAL_CAPSULE | ORAL | Status: DC | PRN

## 2021-05-25 MED ORDER — TETANUS-DIPHTH-ACELL PERTUSSIS 5-2.5-18.5 LF-MCG/0.5 IM SUSY: 0.5000 mL | PREFILLED_SYRINGE | Freq: Once | INTRAMUSCULAR | Status: DC

## 2021-05-25 MED ORDER — KETOROLAC TROMETHAMINE 30 MG/ML IJ SOLN
INTRAMUSCULAR | Status: AC
Start: 2021-05-25 — End: ?
  Filled 2021-05-25: qty 1

## 2021-05-25 MED ORDER — CEFAZOLIN IN SODIUM CHLORIDE 3-0.9 GM/100ML-% IV SOLN
INTRAVENOUS | Status: AC
  Filled 2021-05-25: qty 100

## 2021-05-25 MED ORDER — POVIDONE-IODINE 10 % EX SWAB
2.0000 | Freq: Once | CUTANEOUS | Status: AC
Start: 2021-05-25 — End: 2021-05-25
  Administered 2021-05-25: 2 via TOPICAL

## 2021-05-25 MED ORDER — CEFAZOLIN IN SODIUM CHLORIDE 3-0.9 GM/100ML-% IV SOLN
3.0000 g | INTRAVENOUS | Status: AC
  Administered 2021-05-25: 3 g via INTRAVENOUS

## 2021-05-25 MED ORDER — ONDANSETRON HCL 4 MG/2ML IJ SOLN
INTRAMUSCULAR | Status: DC | PRN
  Administered 2021-05-25: 4 mg via INTRAVENOUS

## 2021-05-25 MED ORDER — ACETAMINOPHEN 10 MG/ML IV SOLN: 1000.0000 mg | Freq: Once | INTRAVENOUS | Status: DC | PRN

## 2021-05-25 MED ORDER — SIMETHICONE 80 MG PO CHEW
80.0000 mg | CHEWABLE_TABLET | ORAL | Status: DC | PRN
  Administered 2021-05-27: 80 mg via ORAL
  Filled 2021-05-25: qty 1

## 2021-05-25 MED ORDER — ONDANSETRON HCL 4 MG/2ML IJ SOLN
INTRAMUSCULAR | Status: AC
  Filled 2021-05-25: qty 2

## 2021-05-25 MED ORDER — ACETAMINOPHEN 10 MG/ML IV SOLN
INTRAVENOUS | Status: DC | PRN
  Administered 2021-05-25: 1000 mg via INTRAVENOUS

## 2021-05-25 MED ORDER — DIPHENHYDRAMINE HCL 25 MG PO CAPS
25.0000 mg | ORAL_CAPSULE | Freq: Four times a day (QID) | ORAL | Status: DC | PRN
Start: 2021-05-25 — End: 2021-05-27

## 2021-05-25 MED ORDER — DIBUCAINE (PERIANAL) 1 % EX OINT: 1.0000 "application " | TOPICAL_OINTMENT | CUTANEOUS | Status: DC | PRN

## 2021-05-25 MED ORDER — PHENYLEPHRINE HCL-NACL 20-0.9 MG/250ML-% IV SOLN
INTRAVENOUS | Status: DC | PRN
Start: 2021-05-25 — End: 2021-05-25
  Administered 2021-05-25: 60 ug/min via INTRAVENOUS

## 2021-05-25 MED ORDER — PRENATAL MULTIVITAMIN CH
1.0000 | ORAL_TABLET | Freq: Every day | ORAL | Status: DC
  Administered 2021-05-26: 1 via ORAL
  Filled 2021-05-25: qty 1

## 2021-05-25 MED ORDER — ACETAMINOPHEN 500 MG PO TABS
1000.0000 mg | ORAL_TABLET | Freq: Four times a day (QID) | ORAL | Status: AC
  Administered 2021-05-25 – 2021-05-26 (×4): 1000 mg via ORAL
  Filled 2021-05-25 (×4): qty 2

## 2021-05-25 MED ORDER — OXYTOCIN-SODIUM CHLORIDE 30-0.9 UT/500ML-% IV SOLN
INTRAVENOUS | Status: DC | PRN
  Administered 2021-05-25: 400 mL via INTRAVENOUS

## 2021-05-25 MED ORDER — LACTATED RINGERS IV SOLN: INTRAVENOUS | Status: DC

## 2021-05-25 MED ORDER — COCONUT OIL OIL: 1.0000 "application " | TOPICAL_OIL | Status: DC | PRN

## 2021-05-25 MED ORDER — OXYTOCIN-SODIUM CHLORIDE 30-0.9 UT/500ML-% IV SOLN
2.5000 [IU]/h | INTRAVENOUS | Status: AC
  Filled 2021-05-25: qty 500

## 2021-05-25 MED ORDER — ACETAMINOPHEN 10 MG/ML IV SOLN
INTRAVENOUS | Status: AC
  Filled 2021-05-25: qty 100

## 2021-05-25 MED ORDER — ACETAMINOPHEN 10 MG/ML IV SOLN: INTRAVENOUS | Status: DC | PRN

## 2021-05-25 MED ORDER — SODIUM CHLORIDE 0.9% FLUSH: 3.0000 mL | INTRAVENOUS | Status: DC | PRN

## 2021-05-25 MED ORDER — KETOROLAC TROMETHAMINE 30 MG/ML IJ SOLN
30.0000 mg | Freq: Four times a day (QID) | INTRAMUSCULAR | Status: AC | PRN
  Administered 2021-05-25 – 2021-05-26 (×3): 30 mg via INTRAVENOUS
  Filled 2021-05-25 (×2): qty 1

## 2021-05-25 MED ORDER — MORPHINE SULFATE (PF) 0.5 MG/ML IJ SOLN
INTRAMUSCULAR | Status: DC | PRN
Start: 2021-05-25 — End: 2021-05-25
  Administered 2021-05-25: .15 mg via INTRATHECAL

## 2021-05-25 MED ORDER — SIMETHICONE 80 MG PO CHEW
80.0000 mg | CHEWABLE_TABLET | Freq: Three times a day (TID) | ORAL | Status: DC
  Administered 2021-05-25 – 2021-05-26 (×4): 80 mg via ORAL
  Filled 2021-05-25 (×4): qty 1

## 2021-05-25 MED ORDER — OXYTOCIN-SODIUM CHLORIDE 30-0.9 UT/500ML-% IV SOLN
INTRAVENOUS | Status: AC
  Filled 2021-05-25: qty 500

## 2021-05-25 MED ORDER — SCOPOLAMINE 1 MG/3DAYS TD PT72
1.0000 | MEDICATED_PATCH | Freq: Once | TRANSDERMAL | Status: DC
  Administered 2021-05-25: 1.5 mg via TRANSDERMAL
  Filled 2021-05-25: qty 1

## 2021-05-25 MED ORDER — DIPHENHYDRAMINE HCL 50 MG/ML IJ SOLN: 12.5000 mg | INTRAMUSCULAR | Status: DC | PRN

## 2021-05-25 MED ORDER — OXYCODONE-ACETAMINOPHEN 5-325 MG PO TABS
1.0000 | ORAL_TABLET | Freq: Four times a day (QID) | ORAL | Status: DC | PRN
  Administered 2021-05-26 (×3): 1 via ORAL
  Filled 2021-05-25 (×3): qty 1

## 2021-05-25 MED ORDER — WITCH HAZEL-GLYCERIN EX PADS: 1.0000 "application " | MEDICATED_PAD | CUTANEOUS | Status: DC | PRN

## 2021-05-25 MED ORDER — PHENYLEPHRINE HCL-NACL 20-0.9 MG/250ML-% IV SOLN
INTRAVENOUS | Status: AC
  Filled 2021-05-25: qty 250

## 2021-05-25 MED ORDER — IBUPROFEN 600 MG PO TABS
600.0000 mg | ORAL_TABLET | Freq: Four times a day (QID) | ORAL | Status: DC | PRN
  Administered 2021-05-26 – 2021-05-27 (×4): 600 mg via ORAL
  Filled 2021-05-25 (×4): qty 1

## 2021-05-25 MED ORDER — FENTANYL CITRATE (PF) 100 MCG/2ML IJ SOLN: 25.0000 ug | INTRAMUSCULAR | Status: DC | PRN

## 2021-05-25 MED ORDER — FENTANYL CITRATE (PF) 100 MCG/2ML IJ SOLN
INTRAMUSCULAR | Status: AC
  Filled 2021-05-25: qty 2

## 2021-05-25 MED ORDER — ONDANSETRON HCL 4 MG/2ML IJ SOLN: 4.0000 mg | Freq: Three times a day (TID) | INTRAMUSCULAR | Status: DC | PRN

## 2021-05-25 MED ORDER — MORPHINE SULFATE (PF) 0.5 MG/ML IJ SOLN
INTRAMUSCULAR | Status: AC
  Filled 2021-05-25: qty 10

## 2021-05-25 MED ORDER — FENTANYL CITRATE (PF) 100 MCG/2ML IJ SOLN
INTRAMUSCULAR | Status: DC | PRN
  Administered 2021-05-25: 15 ug via INTRATHECAL

## 2021-05-25 MED ORDER — MENTHOL 3 MG MT LOZG: 1.0000 | LOZENGE | OROMUCOSAL | Status: DC | PRN

## 2021-05-25 MED ORDER — SENNOSIDES-DOCUSATE SODIUM 8.6-50 MG PO TABS
2.0000 | ORAL_TABLET | ORAL | Status: DC
  Administered 2021-05-26: 2 via ORAL
  Filled 2021-05-25: qty 2

## 2021-05-25 MED ORDER — ZOLPIDEM TARTRATE 5 MG PO TABS: 5.0000 mg | ORAL_TABLET | Freq: Every evening | ORAL | Status: DC | PRN

## 2021-05-25 MED ORDER — KETOROLAC TROMETHAMINE 30 MG/ML IJ SOLN: 30.0000 mg | Freq: Four times a day (QID) | INTRAMUSCULAR | Status: AC | PRN

## 2021-05-25 SURGICAL SUPPLY — 43 items
BENZOIN TINCTURE PRP APPL 2/3 (GAUZE/BANDAGES/DRESSINGS) ×2 IMPLANT
CHLORAPREP W/TINT 26ML (MISCELLANEOUS) ×2 IMPLANT
CLAMP CORD UMBIL (MISCELLANEOUS) IMPLANT
CLOSURE STERI STRIP 1/2 X4 (GAUZE/BANDAGES/DRESSINGS) ×1 IMPLANT
CLOTH BEACON ORANGE TIMEOUT ST (SAFETY) ×2 IMPLANT
DRAIN JACKSON PRT FLT 10 (DRAIN) IMPLANT
DRSG OPSITE POSTOP 4X10 (GAUZE/BANDAGES/DRESSINGS) ×2 IMPLANT
ELECT REM PT RETURN 9FT ADLT (ELECTROSURGICAL) ×2
ELECTRODE REM PT RTRN 9FT ADLT (ELECTROSURGICAL) ×1 IMPLANT
EVACUATOR SILICONE 100CC (DRAIN) IMPLANT
EXTRACTOR VACUUM M CUP 4 TUBE (SUCTIONS) IMPLANT
GLOVE BIO SURGEON STRL SZ 6.5 (GLOVE) ×2 IMPLANT
GLOVE BIOGEL PI IND STRL 7.0 (GLOVE) ×2 IMPLANT
GLOVE BIOGEL PI INDICATOR 7.0 (GLOVE) ×2
GOWN STRL REUS W/TWL LRG LVL3 (GOWN DISPOSABLE) ×4 IMPLANT
HEMOSTAT SURGICEL 2X3 (HEMOSTASIS) ×1 IMPLANT
KIT ABG SYR 3ML LUER SLIP (SYRINGE) IMPLANT
MAT PREVALON FULL STRYKER (MISCELLANEOUS) ×1 IMPLANT
NDL HYPO 25X5/8 SAFETYGLIDE (NEEDLE) IMPLANT
NEEDLE HYPO 25X5/8 SAFETYGLIDE (NEEDLE) IMPLANT
NS IRRIG 1000ML POUR BTL (IV SOLUTION) ×2 IMPLANT
PACK C SECTION WH (CUSTOM PROCEDURE TRAY) ×2 IMPLANT
PAD OB MATERNITY 4.3X12.25 (PERSONAL CARE ITEMS) ×2 IMPLANT
PENCIL SMOKE EVAC W/HOLSTER (ELECTROSURGICAL) ×2 IMPLANT
RETRACTOR TRAXI PANNICULUS (MISCELLANEOUS) IMPLANT
RTRCTR C-SECT PINK 25CM LRG (MISCELLANEOUS) IMPLANT
STRIP CLOSURE SKIN 1/2X4 (GAUZE/BANDAGES/DRESSINGS) ×2 IMPLANT
SUT CHROMIC 0 CT 1 (SUTURE) ×2 IMPLANT
SUT MNCRL AB 3-0 PS2 27 (SUTURE) ×2 IMPLANT
SUT PLAIN 2 0 (SUTURE) ×2
SUT PLAIN 2 0 XLH (SUTURE) ×2 IMPLANT
SUT PLAIN ABS 2-0 CT1 27XMFL (SUTURE) ×2 IMPLANT
SUT SILK 2 0 SH (SUTURE) IMPLANT
SUT VIC AB 0 CTX 36 (SUTURE) ×4
SUT VIC AB 0 CTX36XBRD ANBCTRL (SUTURE) ×4 IMPLANT
SUT VIC AB 2-0 CT1 27 (SUTURE) ×1
SUT VIC AB 2-0 CT1 TAPERPNT 27 (SUTURE) IMPLANT
SUT VIC AB 2-0 SH 27 (SUTURE) ×1
SUT VIC AB 2-0 SH 27XBRD (SUTURE) IMPLANT
TOWEL OR 17X24 6PK STRL BLUE (TOWEL DISPOSABLE) ×2 IMPLANT
TRAXI PANNICULUS RETRACTOR (MISCELLANEOUS) ×2
TRAY FOLEY W/BAG SLVR 14FR LF (SET/KITS/TRAYS/PACK) ×2 IMPLANT
WATER STERILE IRR 1000ML POUR (IV SOLUTION) ×2 IMPLANT

## 2021-05-25 NOTE — Anesthesia Procedure Notes (Signed)
Spinal ? ?Patient location during procedure: OR ?Start time: 05/25/2021 10:05 AM ?End time: 05/25/2021 10:09 AM ?Reason for block: surgical anesthesia ?Staffing ?Performed: anesthesiologist  ?Anesthesiologist: Freddrick March, MD ?Preanesthetic Checklist ?Completed: patient identified, IV checked, risks and benefits discussed, surgical consent, monitors and equipment checked, pre-op evaluation and timeout performed ?Spinal Block ?Patient position: sitting ?Prep: DuraPrep and site prepped and draped ?Patient monitoring: cardiac monitor, continuous pulse ox and blood pressure ?Approach: midline ?Location: L3-4 ?Injection technique: single-shot ?Needle ?Needle type: Pencan  ?Needle gauge: 24 G ?Needle length: 9 cm ?Assessment ?Sensory level: T6 ?Events: CSF return ?Additional Notes ?Functioning IV was confirmed and monitors were applied. Sterile prep and drape, including hand hygiene and sterile gloves were used. The patient was positioned and the spine was prepped. The skin was anesthetized with lidocaine.  Free flow of clear CSF was obtained prior to injecting local anesthetic into the CSF.  The spinal needle aspirated freely following injection.  The needle was carefully withdrawn.  The patient tolerated the procedure well.  ? ? ? ?

## 2021-05-25 NOTE — Op Note (Signed)
Cesarean Section Procedure Note  ? ?Faythe Casa ? ?05/25/2021 ? ?Indications: Scheduled Proceedure/Maternal Request  ? ?Pre-operative Diagnosis: GESTATIONAL DIABETES MELLITUS.  ? ?Post-operative Diagnosis: Same  ? ?Surgeon: Juliann Mule) and Role: ?   * Crawford Givens, MD - Primary  ? ?Assistants: none  ? ?Anesthesia: spinal  ? ?Procedure Details:  ?The patient was seen in the Holding Room. The risks, benefits, complications, treatment options, and expected outcomes were discussed with the patient. The patient concurred with the proposed plan, giving informed consent. identified as Dana Smith and the procedure verified as C-Section Delivery. A Time Out was held and the above information confirmed.  ?After induction of anesthesia, the patient was draped and prepped in the usual sterile manner. A transverse incision was made and carried down through the subcutaneous tissue to the fascia. Fascial incision was made in the midline and extended transversely. The fascia was separated from the underlying rectus muscle superiorly and inferiorly. The peritoneum was identified and entered. Peritoneal incision was extended longitudinally with good visualization of bowel and bladder. The utero-vesical peritoneal reflection was incised transversely and the bladder flap was bluntly freed from the lower uterine segment.  An alexsis retractor was placed in the abdomen.   A low transverse uterine incision was made. It was hard to deliver the head.  I used the vaccuum in the green zone with two pop offs and three pulls in correct position.  I had to cut the muscle on both sides to to make more room.   Delivered from cephalic presentation was a  infant, with Apgar scores of 7 at one minute and 9 at five minutes. Cord ph was  8.41  the umbilical cord was clamped and cut cord blood was obtained for evaluation. The placenta was removed Intact and appeared normal. The uterine outline, tubes and ovaries appeared normal}.  The uterine incision was closed with running locked sutures of 0Vicryl. A second layer 0 vicrlyl was used to imbricate the uterine incision    Hemostasis was observed. Lavage was carried out until clear. The alexsis was removed.  The peritoneum was closed with 0 chromic.  The muscles were examined and any bleeders were made hemostatic using bovie cautery device.  The muscles were reappoximated using 2-0 vicryl.  Surgicel was placed along the muscle on the right side.   The fascia was then reapproximated with running sutures of 0 vicryl.  The subcutaneous tissue was reapproximated  With interrupted stitches using 2-0 plain gut. The subcuticular closure was performed using 3-19moocryl    ? ?Instrument, sponge, and needle counts were correct prior the abdominal closure and were correct at the conclusion of the case.  ? ? ?Findings: infant was delivered from vtx presentation. The fluid was c;ear.  The uterus tubes and ovaries appeared normal.   ?  ?Estimated Blood Loss: 4062m ? ?Total IV Fluids: 170053m? ?Urine Output:  175CC OF clear urine ? ?Specimens placenta ? ?Complications: no complications ? ?Disposition: PACU - hemodynamically stable.  ? ?Maternal Condition: stable  ? ?Baby condition / location:  Couplet care / Skin to Skin ? ?Attending Attestation: I performed the procedure.  ? ?Signed: ?Surgeon(s): ?DilCrawford GivensD ? ? ?  ?

## 2021-05-25 NOTE — Transfer of Care (Signed)
Immediate Anesthesia Transfer of Care Note ? ?Patient: Dana Smith ? ?Procedure(s) Performed: CESAREAN SECTION ? ?Patient Location: PACU ? ?Anesthesia Type:Spinal ? ?Level of Consciousness: awake, alert  and oriented ? ?Airway & Oxygen Therapy: Patient Spontanous Breathing ? ?Post-op Assessment: Report given to RN and Post -op Vital signs reviewed and stable ? ?Post vital signs: Reviewed and stable ? ?Last Vitals:  ?Vitals Value Taken Time  ?BP 105/70 05/25/21 1136  ?Temp    ?Pulse 76 05/25/21 1139  ?Resp 22 05/25/21 1139  ?SpO2 100 % 05/25/21 1139  ?Vitals shown include unvalidated device data. ? ?Last Pain:  ?Vitals:  ? 05/25/21 0826  ?TempSrc: Oral  ?   ? ?  ? ?Complications: No notable events documented. ?

## 2021-05-25 NOTE — Lactation Note (Signed)
This note was copied from a baby's chart. ?Lactation Consultation Note ? ?Patient Name: Dana Smith ?Today's Date: 05/25/2021 ?Reason for consult: Initial assessment;Term;Maternal endocrine disorder ?Age:38 hours ? ?See below for interventions. Infant latches with relative ease & frequent swallows were noted (swallows verified by cervical auscultation). Mom reports having leaked for the last few weeks. Mom reports that her milk sprayed earlier when RN was checking her breasts.  ? ? ?Maternal Data ?Does the patient have breastfeeding experience prior to this delivery?: Yes ?How long did the patient breastfeed?: 1st infant wouldn't latch; pumped & BO for 4 months ? ?Interventions ?Interventions: Education;Assisted with latch;Adjust position;[facility] Services brochure ? ? ?Matthias Hughs Elnora ?05/25/2021, 1:55 PM ? ? ? ?

## 2021-05-25 NOTE — Anesthesia Postprocedure Evaluation (Signed)
Anesthesia Post Note ? ?Patient: Dana Smith ? ?Procedure(s) Performed: CESAREAN SECTION ? ?  ? ?Patient location during evaluation: PACU ?Anesthesia Type: Spinal ?Level of consciousness: oriented and awake and alert ?Pain management: pain level controlled ?Vital Signs Assessment: post-procedure vital signs reviewed and stable ?Respiratory status: spontaneous breathing, respiratory function stable and patient connected to nasal cannula oxygen ?Cardiovascular status: blood pressure returned to baseline and stable ?Postop Assessment: no headache, no backache and no apparent nausea or vomiting ?Anesthetic complications: no ? ? ?No notable events documented. ? ?Last Vitals:  ?Vitals:  ? 05/25/21 1253 05/25/21 1408  ?BP: 112/83 101/64  ?Pulse: 70 78  ?Resp: 16 17  ?Temp: 36.7 ?C 36.7 ?C  ?SpO2: 100% 100%  ?  ?Last Pain:  ?Vitals:  ? 05/25/21 1408  ?TempSrc: Oral  ? ?Pain Goal:   ? ?  ?  ?  ?  ?  ?  ?  ? ?Keyon Liller L Synethia Endicott ? ? ? ? ?

## 2021-05-25 NOTE — Anesthesia Preprocedure Evaluation (Signed)
Anesthesia Evaluation  ?Patient identified by MRN, date of birth, ID band ?Patient awake ? ? ? ?Reviewed: ?Allergy & Precautions, NPO status , Patient's Chart, lab work & pertinent test results ? ?Airway ?Mallampati: III ? ?TM Distance: >3 FB ?Neck ROM: Full ? ? ? Dental ?no notable dental hx. ? ?  ?Pulmonary ?neg pulmonary ROS,  ?  ?Pulmonary exam normal ?breath sounds clear to auscultation ? ? ? ? ? ? Cardiovascular ?hypertension, negative cardio ROS ?Normal cardiovascular exam ?Rhythm:Regular Rate:Normal ? ? ?  ?Neuro/Psych ?PSYCHIATRIC DISORDERS Anxiety negative neurological ROS ?   ? GI/Hepatic ?GERD  ,  ?Endo/Other  ?diabetes, GestationalMorbid obesity (BMI 46) ? Renal/GU ?negative Renal ROS  ?negative genitourinary ?  ?Musculoskeletal ?negative musculoskeletal ROS ?(+)  ? Abdominal ?  ?Peds ? Hematology ?negative hematology ROS ?(+)   ?Anesthesia Other Findings ?Repeat C/S ? Reproductive/Obstetrics ? ?  ? ? ? ? ? ? ? ? ? ? ? ? ? ?  ?  ? ? ? ? ? ? ? ? ?Anesthesia Physical ?Anesthesia Plan ? ?ASA: 3 ? ?Anesthesia Plan: Spinal  ? ?Post-op Pain Management:   ? ?Induction:  ? ?PONV Risk Score and Plan: Treatment may vary due to age or medical condition ? ?Airway Management Planned: Natural Airway ? ?Additional Equipment:  ? ?Intra-op Plan:  ? ?Post-operative Plan:  ? ?Informed Consent: I have reviewed the patients History and Physical, chart, labs and discussed the procedure including the risks, benefits and alternatives for the proposed anesthesia with the patient or authorized representative who has indicated his/her understanding and acceptance.  ? ? ? ?Dental advisory given ? ?Plan Discussed with: CRNA ? ?Anesthesia Plan Comments:   ? ? ? ? ? ? ?Anesthesia Quick Evaluation ? ?

## 2021-05-25 NOTE — H&P (Signed)
Dana Smith is a 36 y.o. female presenting for repeat CS at 39 weeks. ?OB History   ? ? Gravida  ?5  ? Para  ?1  ? Term  ?1  ? Preterm  ?   ? AB  ?2  ? Living  ?1  ?  ? ? SAB  ?1  ? IAB  ?   ? Ectopic  ?1  ? Multiple  ?0  ? Live Births  ?1  ?   ?  ?  ? ?Past Medical History:  ?Diagnosis Date  ? Anemia   ? low iron  ? Anxiety   ? Arrest of dilation, delivered, current hospitalization 06/25/2019  ? Fibroid   ? GERD (gastroesophageal reflux disease)   ? Gestational diabetes   ? metformin   (2021)  ? Pneumonia   ? as a baby  ? Preeclampsia   ? pulmonary edema post partum (2021, May)  ? ?Past Surgical History:  ?Procedure Laterality Date  ? CESAREAN SECTION N/A 06/25/2019  ? Procedure: CESAREAN SECTION;  Surgeon: Sherlyn Hay, DO;  Location: [facility];  Service: Obstetrics;  Laterality: N/A;  ? DILATION AND EVACUATION N/A 07/29/2020  ? Procedure: DILATATION AND EVACUATION;  Surgeon: Crawford Givens, MD;  Location: [place];  Service: Gynecology;  Laterality: N/A;  ? LAPAROSCOPY Left 08/11/2018  ? Procedure: LAPAROSCOPY DIAGNOSTIC, LEFT SALPINGECTOMY WITH REMOVAL OF ECTOPIC PREGNANCY;  Surgeon: Sherlyn Hay, DO;  Location: [place];  Service: Gynecology;  Laterality: Left;  ? WISDOM TOOTH EXTRACTION Bilateral   ? ?Family History: family history includes Diabetes in her maternal grandmother, maternal uncle, and sister; Healthy in her father and mother. ?Social History:  reports that she has never smoked. She has never used smokeless tobacco. She reports that she does not currently use alcohol. She reports that she does not use drugs. ? ? ?  ?Maternal Diabetes: Yes:  Diabetes Type:  Insulin/Medication controlled ?Genetic Screening: Normal ?Maternal Ultrasounds/Referrals: Normal ?Fetal Ultrasounds or other Referrals:  None ?Maternal Substance Abuse:  No ?Significant Maternal Medications:  None ?Significant Maternal Lab Results:  Group B Strep negative ?Other Comments:  None ? ?Review of Systems ?History ?   ?Blood pressure 111/82, pulse 93, temperature 98.2 ?F (36.8 ?C), temperature source Oral, resp. rate 18, height '5\' 6"'$  (1.676 m), weight 129.9 kg, SpO2 99 %, unknown if currently breastfeeding. ?Exam ?Physical Exam  ?Physical Examination: General appearance - alert, well appearing, and in no distress ?Chest - clear to auscultation, no wheezes, rales or rhonchi, symmetric air entry ?Heart - normal rate, regular rhythm, normal S1, S2, no murmurs, rubs, clicks or gallops ?Abdomen - soft, nontender, nondistended, no masses or organomegaly ?Gravid and soft ?Extremities - Homan's sign negative bilaterally  ?Prenatal labs: ?ABO, Rh: --/--/O POS (03/31 5520) ?Antibody: NEG (03/31 0941) ?Rubella: Immune (08/31 0000) ?RPR: NON REACTIVE (03/31 0939)  ?HBsAg: Negative (08/31 0000)  ?HIV: Non-reactive (08/31 0000)  ?GBS:    ? ?Assessment/Plan: ?Term at 21 weeks ?GDM on metformin BS stable ?Plan repat CS risks and benefits reviewed in detail ?She is planning v[facility] for [facility]  ? ?Dana Smith ?05/25/2021, 9:51 AM ? ? ? ? ?

## 2021-05-26 LAB — CBC
HCT: 29.1 % — ABNORMAL LOW (ref 36.0–46.0)
Hemoglobin: 9.5 g/dL — ABNORMAL LOW (ref 12.0–15.0)
MCH: 27.1 pg (ref 26.0–34.0)
MCHC: 32.6 g/dL (ref 30.0–36.0)
MCV: 83.1 fL (ref 80.0–100.0)
Platelets: 249 10*3/uL (ref 150–400)
RBC: 3.5 MIL/uL — ABNORMAL LOW (ref 3.87–5.11)
RDW: 16.3 % — ABNORMAL HIGH (ref 11.5–15.5)
WBC: 9.4 10*3/uL (ref 4.0–10.5)
nRBC: 0 % (ref 0.0–0.2)

## 2021-05-26 LAB — GLUCOSE, CAPILLARY: Glucose-Capillary: 100 mg/dL — ABNORMAL HIGH (ref 70–99)

## 2021-05-26 NOTE — Lactation Note (Signed)
This note was copied from a baby's chart. ?Lactation Consultation Note ? ?Patient Name: Dana Smith ?Today's Date: 05/26/2021 ?  ?Age:38 hours ?P2, term female infant, -6% weight loss. ?Per mom, she would like to rest at this time and she will call [facility] services when ready. ?[facility] written name on white board in room. ?Maternal Data ?  ? ?Feeding ?  ? ?LATCH Score ?Latch: Grasps breast easily, tongue down, lips flanged, rhythmical sucking. ? ?Audible Swallowing: A few with stimulation ? ?Type of Nipple: Everted at rest and after stimulation ? ?Comfort (Breast/Nipple): Soft / non-tender ? ?Hold (Positioning): No assistance needed to correctly position infant at breast. ? ?LATCH Score: 9 ? ? ?Lactation Tools Discussed/Used ?  ? ?Interventions ?  ? ?Discharge ?  ? ?Consult Status ?  ? ? ? ?Vicente Serene ?05/26/2021, 4:37 PM ? ? ? ?

## 2021-05-26 NOTE — Lactation Note (Signed)
This note was copied from a baby's chart. ?Lactation Consultation Note ? ?Patient Name: Dana Smith ?Today's Date: 05/26/2021 ?  ?Age:38 hours ?Per RN, mom declined [facility] services tonight.  ?Maternal Data ?  ? ?Feeding ?  ? ?LATCH Score ?  ? ?  ? ?  ? ?  ? ?  ? ?  ? ? ?Lactation Tools Discussed/Used ?  ? ?Interventions ?  ? ?Discharge ?  ? ?Consult Status ?  ? ? ? ?Vicente Serene ?05/26/2021, 9:56 PM ? ? ? ?

## 2021-05-26 NOTE — Progress Notes (Signed)
CSW received consult for hx of Anxiety.  CSW met with MOB to offer support and complete assessment, FOB present. MOB granted CSW verbal permission to speak in front of FOB about anything. MOB was welcoming, pleasant, open, and remained engaged during assessment. CSW and MOB discussed MOB's mental health history. MOB reported that she was diagnosed with anxiety in 2017. MOB denied any current symptoms at the moment, noting it changes day to day. MOB described her anxiety as having a lot of worry and anxiousness. MOB reported that she had a panic attack yesterday before delivery. CSW inquired about the frequency of MOB's panic attacks. MOB reported that she may have one a month which is an improvement from the past. CSW inquired about how MOB manages the symptoms of her anxiety, MOB reported that she cries it out which helps. MOB reported that she is not currently taking any medication or participating in therapy. MOB reported that she doesn't feel she needs any therapy resources. MOB denied any additional mental health history including postpartum depression. CSW inquired about how MOB was feeling emotionally since giving birth, MOB reported that she was feeling good and also tired. CSW acknowledged, validated, and normalized MOB's feelings. MOB presented calm and did not demonstrate any acute mental health signs/symptoms. CSW assessed for safety, MOB denied SI and HI. CSW did not assess for domestic violence as FOB was present. CSW inquired about MOB's support system, MOB reported that FOB, her cousin, and sister are supports. FOB was pleasant and engaged at times during assessment.  ? ?CSW provided education regarding the baby blues period vs. perinatal mood disorders, discussed treatment and gave resources for mental health follow up if concerns arise.  CSW recommends self-evaluation during the postpartum time period using the New Mom Checklist from Postpartum Progress and encouraged MOB to contact a medical  professional if symptoms are noted at any time.   ? ?CSW provided review of Sudden Infant Death Syndrome (SIDS) precautions. Parents verbalized understanding and reported having all items needed to care for infant including a car seat and basinet.  ? ?CSW identifies no further need for intervention and no barriers to discharge at this time. ? ?Donnia Poplaski, LCSW ?Clinical Social Worker ?[facility] ?Cell#: [phone] ? ?

## 2021-05-26 NOTE — Progress Notes (Addendum)
Subjective: ?POD# 1 ?Information for the patient's newborn:  Juanice, [place] [[id number]]  ?female   ?Baby's Name Irma Newness ?Circumcision In-patient ? ?Reports feeling good ?Feeding: breast ?Reports tolerating PO and denies N/V, foley removed, ambulating and urinating w/o difficulty  ?Pain controlled with  PO meds ?Denies HA/SOB/dizziness  ?Flatus not passing  ?Vaginal bleeding is normal, no clots    ? ?Objective: ? VS:  ?Vitals:  ? 05/25/21 1630 05/25/21 2020 05/26/21 0000 05/26/21 0433  ?BP:  100/65 101/63 (!) 98/56  ?Pulse:  76 80 71  ?Resp: '18 18 18 16  '$ ?Temp: 98 ?F (36.7 ?C) 97.7 ?F (36.5 ?C) 98.1 ?F (36.7 ?C) 98.3 ?F (36.8 ?C)  ?TempSrc: Oral Oral Oral Oral  ?SpO2:  96% 97% 100%  ?Weight:      ?Height:      ? ? ?Intake/Output Summary (Last 24 hours) at 05/26/2021 0629 ?Last data filed at 05/26/2021 0435 ?Gross per 24 hour  ?Intake 4025.89 ml  ?Output 1783 ml  ?Net 2242.89 ml  ?   ?Recent Labs  ?  05/26/21 ?0037  ?WBC 9.4  ?HGB 9.5*  ?HCT 29.1*  ?PLT 249  ? ? ?Blood type: --/--/O POS (03/31 0488) ?Rubella: Immune (08/31 0000)  ?  ?Physical Exam:  ?General: alert, cooperative, and no distress ?CV: Regular rate and rhythm ?Resp: clear ?Abdomen: soft, nontender, normal bowel sounds ?Incision:  scant serosanguineous drainage ?Uterine Fundus: firm, below umbilicus, nontender ?Lochia:  appropriate ?Ext:  neg pain, tenderness, edema, or cords ? ? ?Assessment/Plan: ?36 y.o.   POD# 1. [id number]  ?                ?Principal Problem: ?  Other specified postprocedural states ?Active Problems: ?  S/P C-section ?A2DM ? ?Routine post-op PP care  ?Fasting CBC         ?Advance diet as tolerated ?Advised warm fluids and ambulation to improve GI motility ?Breastfeeding support ?Anticipate D/C 05/27/21 ? ?Arrie Eastern, MSN, CNM ?05/26/2021, 6:29 AM ? ? ?

## 2021-05-27 LAB — SURGICAL PATHOLOGY

## 2021-05-27 MED ORDER — OXYCODONE-ACETAMINOPHEN 5-325 MG PO TABS
1.0000 | ORAL_TABLET | Freq: Four times a day (QID) | ORAL | 0 refills | Status: AC | PRN
Start: 2021-05-27 — End: 2021-06-01

## 2021-05-27 MED ORDER — IBUPROFEN 600 MG PO TABS
600.0000 mg | ORAL_TABLET | Freq: Four times a day (QID) | ORAL | 0 refills | Status: AC | PRN
Start: 2021-05-27 — End: ?

## 2021-05-27 NOTE — Discharge Summary (Signed)
? ?  Postpartum Discharge Summary ? ?Date of Service updated 05/27/21 ?   ?Patient Name: Dana Smith ?DOB: 1985-10-24 ?MRN: [id number] ? ?Date of admission: 05/25/2021 ?Delivery date:05/25/2021  ?Delivering provider: Crawford Givens  ?Date of discharge: 05/27/2021 ? ?Admitting diagnosis: Other specified postprocedural states [Z98.890] ?S/P C-section [L89.211] ?Intrauterine pregnancy: [redacted]w[redacted]d    ?Secondary diagnosis:  Principal Problem: ?  Other specified postprocedural states ?Active Problems: ?  S/P C-section ? ?Additional problems: Gestational diabetes, oral medication management    ?Discharge diagnosis: Term Pregnancy Delivered and GDM A2                                              ?Post partum procedures: none ?Augmentation: N/A ?Complications: None ? ?Hospital course: Scheduled C/S   36y.o. yo [id number] admitted to the hospital 05/25/2021 for scheduled cesarean section with the following indication:Elective Repeat.Delivery details are as follows:  ?Membrane Rupture Time/Date: 10:36 AM ,05/25/2021   ?Delivery Method:C-Section, Vacuum Assisted  ?Details of operation can be found in separate operative note.  Patient had an uncomplicated postpartum course.  She is ambulating, tolerating a regular diet, passing flatus, and urinating well. Patient is discharged home in stable condition on  05/27/21 ?       ?Newborn Data: ?Birth date:05/[birth year]/2021  ?Birth time:10:39 AM  ?Gender:Female  ?Living status:Living  ?Apgars:7 ,9  ?Weight:3970 g    ? ?Magnesium Sulfate received: No ?BMZ received: No ?Rhophylac:N/A ?MMR:N/A ?Transfusion:No ? ?Physical exam  ?Vitals:  ? 05/26/21 0808 05/26/21 1301 05/26/21 2200 05/27/21 0500  ?BP: 107/62 117/69 106/62 104/60  ?Pulse: 69 72 71 72  ?Resp: _0 ?Temp: 98.7 ?F (37.1 ?C) 97.8 ?F (36.6 ?C) 98.3 ?F (36.8 ?C) 98.4 ?F (36.9 ?C)  ?TempSrc: Oral Oral Oral Oral  ?SpO2: 98% 98% 98%   ?Weight:      ?Height:      ? ?General: alert, cooperative, and no distress ?Lochia:  appropriate ?Uterine Fundus: firm ?Incision: Healing well with no significant drainage, Dressing is clean, dry, and intact ?DVT Evaluation: No evidence of DVT seen on physical exam. ?No cords or calf tenderness. ?No significant calf/ankle edema. ?Labs: ?Lab Results  ?Component Value Date  ? WBC 9.4 05/26/2021  ? HGB 9.5 (L) 05/26/2021  ? HCT 29.1 (L) 05/26/2021  ? MCV 83.1 05/26/2021  ? PLT 249 05/26/2021  ? ? ?  Latest Ref Rng & Units 07/29/2020  ? 12:04 PM  ?CMP  ?Glucose 70 - 99 mg/dL 104    ?BUN 6 - 20 mg/dL 9    ?Creatinine 0.44 - 1.00 mg/dL 0.62    ?Sodium 135 - 145 mmol/L 134    ?Potassium 3.5 - 5.1 mmol/L 3.9    ?Chloride 98 - 111 mmol/L 104    ?CO2 22 - 32 mmol/L 21    ?Calcium 8.9 - 10.3 mg/dL 8.7    ? ?Edinburgh Score: ? ?  05/25/2021  ? 12:53 PM  ?EdFlavia Shipperostnatal Depression Scale Screening Tool  ?I have been able to laugh and see the funny side of things. 0  ?I have looked forward with enjoyment to things. 0  ?I have blamed myself unnecessarily when things went wrong. 1  ?I have been anxious or worried for no good reason. 2  ?I have felt scared or panicky for no good reason. 2  ?Things  have been getting on top of me. 1  ?I have been so unhappy that I have had difficulty sleeping. 0  ?I have felt sad or miserable. 0  ?I have been so unhappy that I have been crying. 0  ?The thought of harming myself has occurred to me. 0  ?Edinburgh Postnatal Depression Scale Total 6  ? ? ? ? ?After visit meds:  ?Allergies as of 05/27/2021   ?No Known Allergies ?  ? ?  ?Medication List  ?  ? ?STOP taking these medications   ? ?acetaminophen 500 MG tablet ?Commonly known as: TYLENOL ?  ?aspirin EC 81 MG tablet ?  ?docusate sodium 100 MG capsule ?Commonly known as: COLACE ?  ?glyBURIDE 5 MG tablet ?Commonly known as: DIABETA ?  ?metFORMIN 500 MG tablet ?Commonly known as: GLUCOPHAGE ?  ? ?  ? ?TAKE these medications   ? ?ibuprofen 600 MG tablet ?Commonly known as: ADVIL ?Take 1 tablet (600 mg total) by mouth every 6 (six)  hours as needed for mild pain. ?  ?multivitamin-prenatal 27-0.8 MG Tabs tablet ?Take 1 tablet by mouth in the morning. ?  ?oxyCODONE-acetaminophen 5-325 MG tablet ?Commonly known as: PERCOCET/ROXICET ?Take 1-2 tablets by mouth every 6 (six) hours as needed for up to 5 days for moderate pain. ?  ? ?  ? ?  ?  ? ? ?  ?Discharge Care Instructions  ?(From admission, onward)  ?  ? ? ?  ? ?  Start     Ordered  ? 05/27/21 0000  Discharge wound care:       ?Comments: Take dressing off on day 5-7 postpartum.  Report increased drainage, redness or warmth. Clean with water, let soap trickle down body. Can leave steri strips on until they fall off or take them off gently at day 10. Keep open to air, clean and dry.  ? 05/27/21 0653  ? ?  ?  ? ?  ? ? ? ?Discharge home in stable condition ?Infant Feeding: Breast ?Infant Disposition:home with mother ?Discharge instruction: per After Visit Summary and Postpartum booklet. ?Activity: Advance as tolerated. Pelvic rest for 6 weeks.  ?Diet: carb modified diet and low salt diet ?Anticipated Birth Control:  Spouse has had a vasectomy ?Postpartum Appointment:6 weeks ?Additional Postpartum F/U:  none ?Future Appointments:No future appointments. ?Follow up Visit: ? Follow-up Information   ? ? Crawford Givens, MD. Go in 6 week(s).   ?Specialty: Obstetrics and Gynecology ?Contact information: ?[address] ?[address] ?[place] [address] ?[phone] ? ? ?  ?  ? ?  ?  ? ?  ? ? ? ?  ? ?05/27/2021 ?Arrie Eastern, CNM ? ? ?

## 2021-06-05 ENCOUNTER — Telehealth (HOSPITAL_COMMUNITY): Payer: Self-pay | Admitting: *Deleted

## 2021-06-05 NOTE — Telephone Encounter (Signed)
Pt husband reports mom is asleep, but doing well with postpartum healing for incision. No concerns about mom at this time. Reports baby is gaining weight, feeding well, peeing and pooping without difficulty. No concerns about baby at this time.  ? ?Odis Hollingshead, RN 06-05-2021 at 11:24am ?

## 2021-08-31 ENCOUNTER — Other Ambulatory Visit: Payer: Self-pay | Admitting: Gastroenterology

## 2021-08-31 DIAGNOSIS — R1011 Right upper quadrant pain: Secondary | ICD-10-CM

## 2021-09-04 ENCOUNTER — Other Ambulatory Visit: Payer: BC Managed Care – PPO

## 2021-09-09 ENCOUNTER — Ambulatory Visit
Admission: RE | Admit: 2021-09-09 | Discharge: 2021-09-09 | Disposition: A | Discharge status: Home / Self Care | Payer: BC Managed Care – PPO | Source: Ambulatory Visit | Attending: Gastroenterology | Admitting: Gastroenterology

## 2021-09-09 DIAGNOSIS — R1011 Right upper quadrant pain: Secondary | ICD-10-CM

## 2022-02-19 IMAGING — CT CT ANGIO CHEST
2 of 6 series · 18 of 46 positions shown · IV contrast (APPLIED)
Comparison: None.

CLINICAL DATA: PE suspected. Give and shortness of breath. The
patient is 6 days post quadrant following Caesarean section.

EXAM:
CT ANGIOGRAPHY CHEST WITH CONTRAST
TECHNIQUE: Multidetector CT imaging of the chest was performed using the
standard protocol during bolus administration of intravenous
contrast. Multiplanar CT image reconstructions and MIPs were
obtained to evaluate the vascular anatomy.
CONTRAST:  50mL OMNIPAQUE IOHEXOL 350 MG/ML SOLN

[Series 7: thins · axial · 0.64mm/px · z∈[+1043,+1283]mm · 15 of 376 slices shown]
[im 17/376  lung]
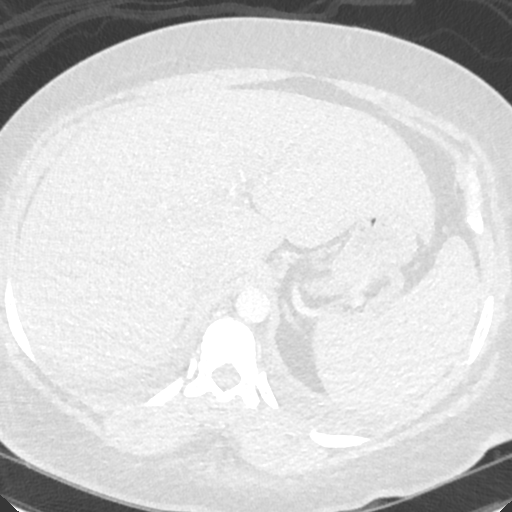
[im 49/376  soft-tissue]
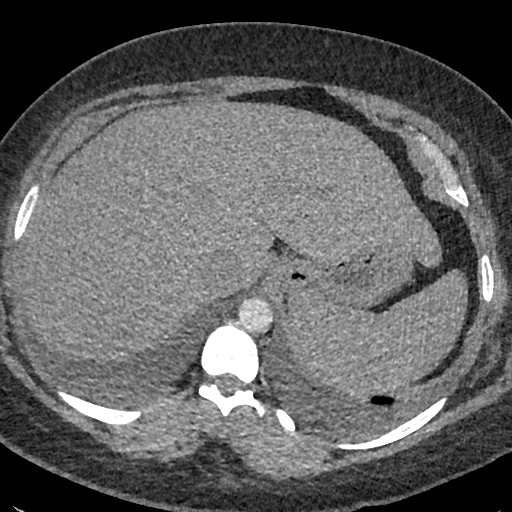
[im 66/376  lung]
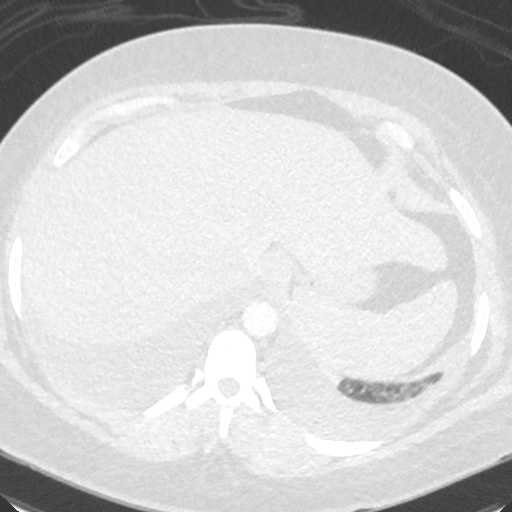
[im 98/376  soft-tissue]
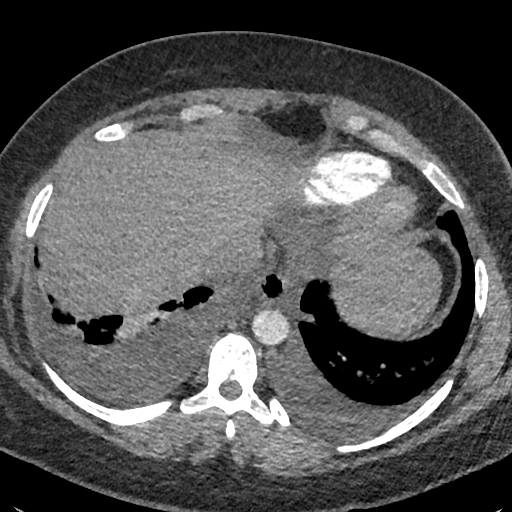
[im 115/376  lung]
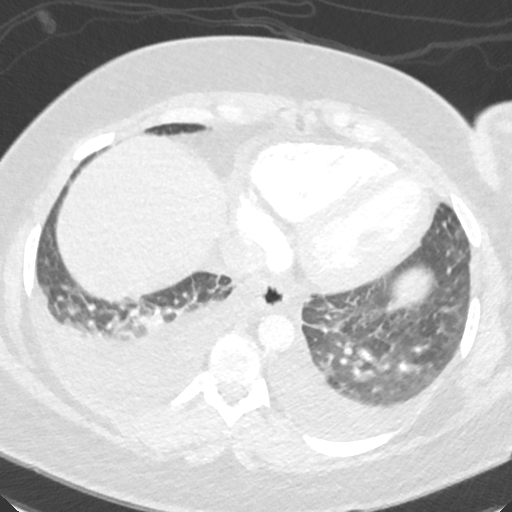
[im 147/376  soft-tissue]
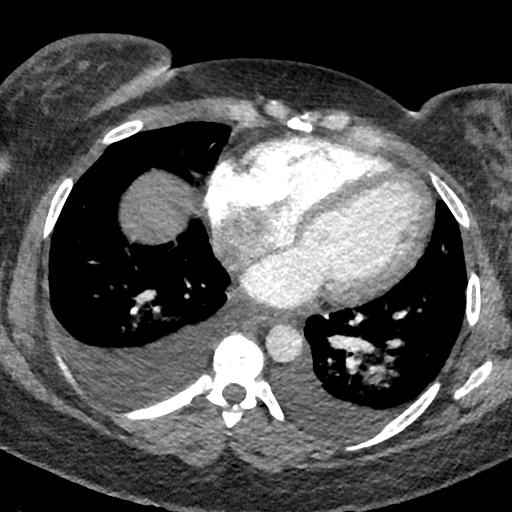
[im 164/376  lung]
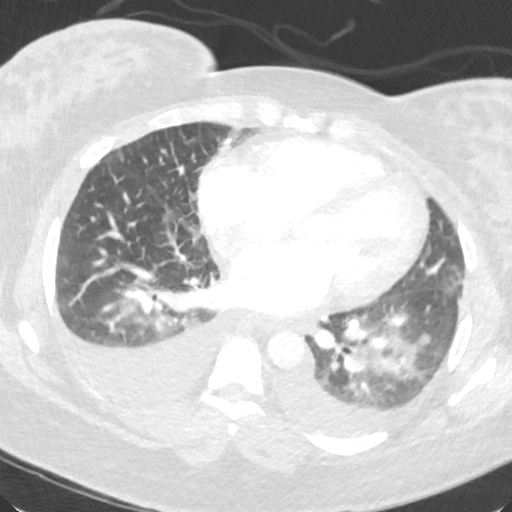
[im 196/376  soft-tissue]
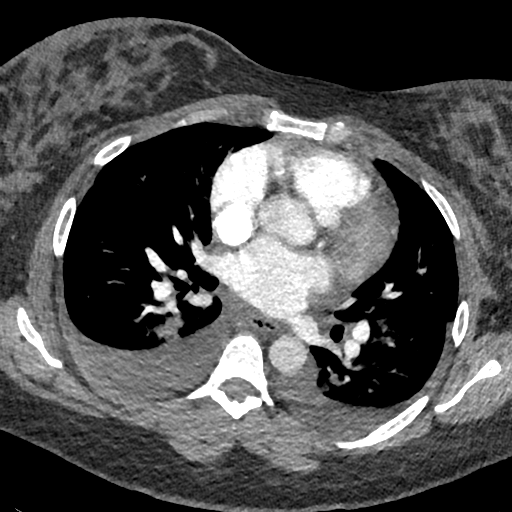
[im 212/376  lung]
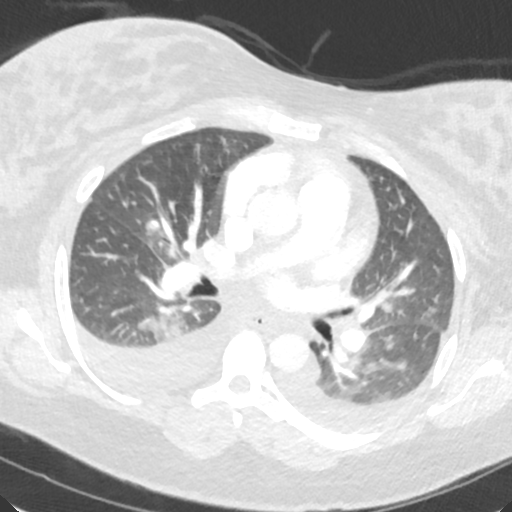
[im 229/376  soft-tissue]
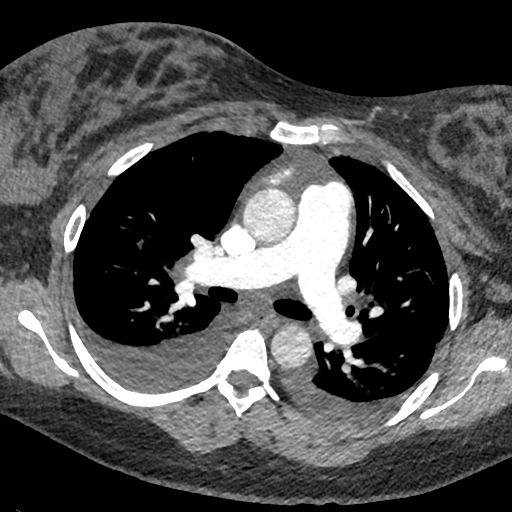
[im 261/376  lung]
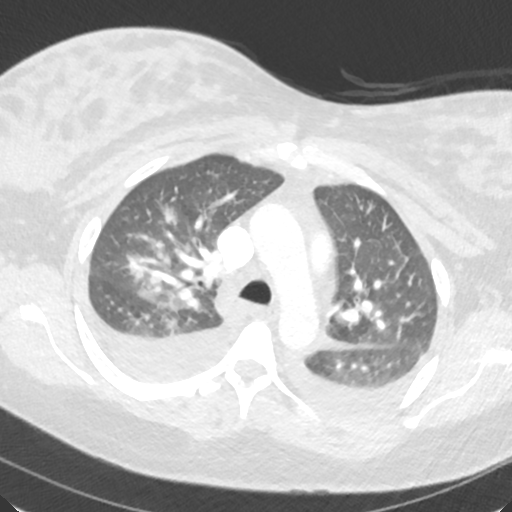
[im 278/376  soft-tissue]
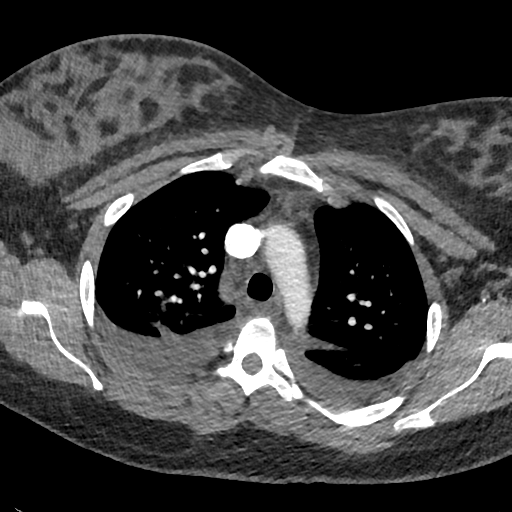
[im 310/376  lung]
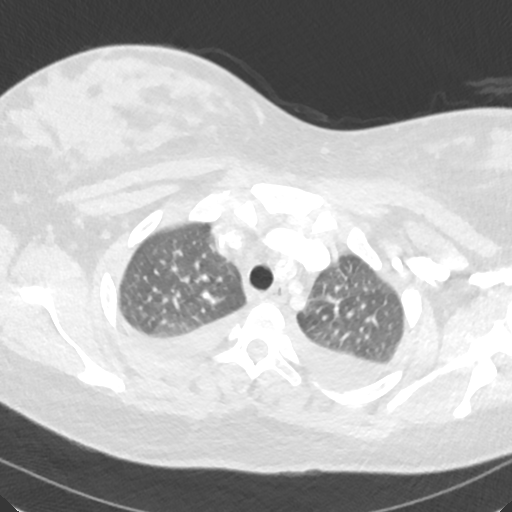
[im 327/376  soft-tissue]
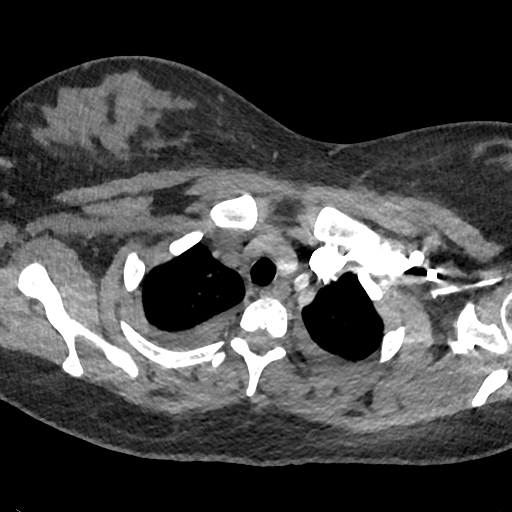
[im 359/376  lung]
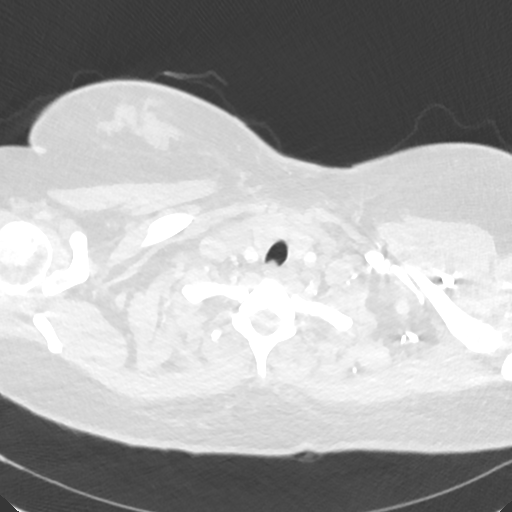

[Series 8: cor · coronal · 0.53mm/px · 3 of 118 slices shown]
[im 30/118  soft-tissue]
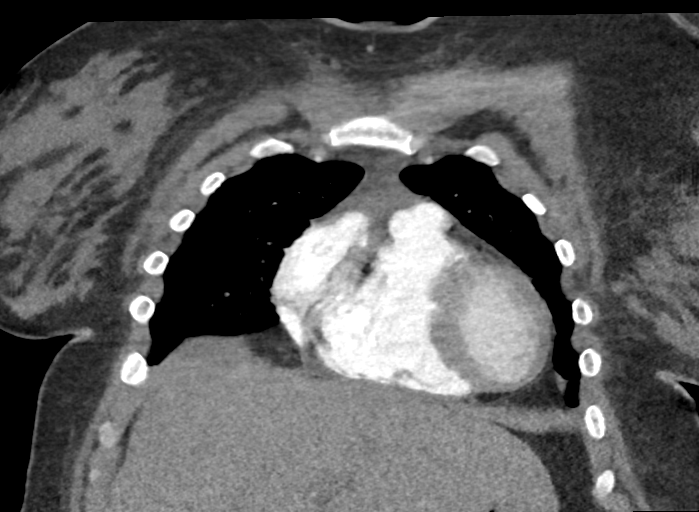
[im 59/118  soft-tissue]
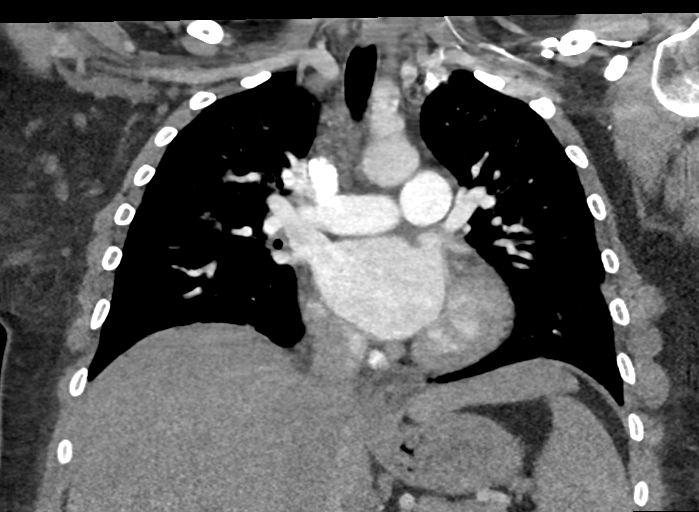
[im 88/118  soft-tissue]
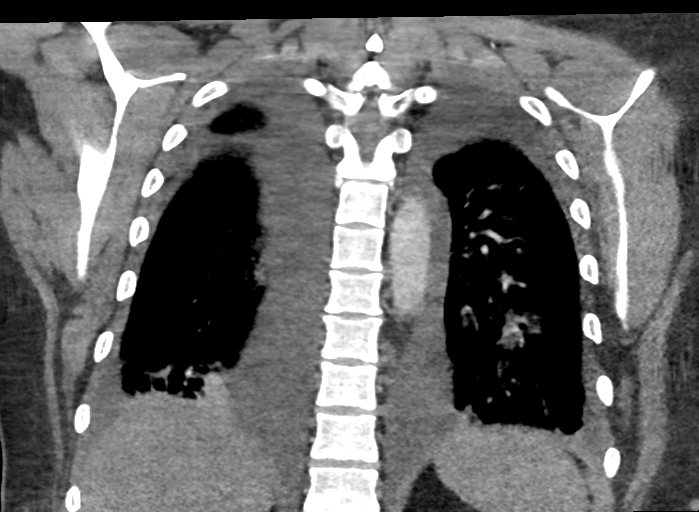

[18 of 46 positions shown; findings below may reference images not displayed]

FINDINGS: Cardiovascular: Evaluation is limited by respiratory motion artifact
and patient body habitus.Given this limitation, no acute pulmonary
embolism was detected. The main pulmonary artery is dilated
measuring approximately 3.2 cm. The heart size is mild-to-moderately
enlarged. The thoracic aorta is unremarkable.

Mediastinum/Nodes:

--No mediastinal or hilar lymphadenopathy.

--No axillary lymphadenopathy.

--No supraclavicular lymphadenopathy.

--Normal thyroid gland.

--The esophagus is unremarkable

Lungs/Pleura: There are moderate-sized bilateral pleural effusions,
right greater than left. There are hazy primarily perihilar
ground-glass airspace opacities with associated interlobular septal
thickening. There is no pneumothorax.

Upper Abdomen: No acute abnormality.

Musculoskeletal: No chest wall abnormality. No acute or significant
osseous findings.

Review of the MIP images confirms the above findings.
IMPRESSION: 1. Limited study as detailed above. Given these limitations, no
acute pulmonary embolism was detected.
2. Moderate bilateral pleural effusions with findings suggestive
interstitial pulmonary edema. An underlying atypical infectious
process can have a similar appearance.
3. Dilated main pulmonary artery which can be seen in patients with
elevated pulmonary artery pressures.
